# Patient Record
Sex: Female | Born: 1990 | Hispanic: Yes | Marital: Single | Smoking: Former smoker
Health system: Southern US, Community
[De-identification: ages and names within clinical notes are randomized; demographics above are authoritative.]

## PROBLEM LIST (undated history)

## (undated) DIAGNOSIS — E039 Hypothyroidism, unspecified: Secondary | ICD-10-CM

## (undated) DIAGNOSIS — G4730 Sleep apnea, unspecified: Secondary | ICD-10-CM

## (undated) DIAGNOSIS — G43909 Migraine, unspecified, not intractable, without status migrainosus: Secondary | ICD-10-CM

## (undated) DIAGNOSIS — F419 Anxiety disorder, unspecified: Secondary | ICD-10-CM

## (undated) DIAGNOSIS — E119 Type 2 diabetes mellitus without complications: Secondary | ICD-10-CM

## (undated) DIAGNOSIS — R7303 Prediabetes: Secondary | ICD-10-CM

## (undated) DIAGNOSIS — F32A Depression, unspecified: Secondary | ICD-10-CM

## (undated) DIAGNOSIS — E0500 Thyrotoxicosis with diffuse goiter without thyrotoxic crisis or storm: Secondary | ICD-10-CM

## (undated) DIAGNOSIS — M12571 Traumatic arthropathy, right ankle and foot: Secondary | ICD-10-CM

## (undated) DIAGNOSIS — Z8619 Personal history of other infectious and parasitic diseases: Secondary | ICD-10-CM

## (undated) DIAGNOSIS — M1990 Unspecified osteoarthritis, unspecified site: Secondary | ICD-10-CM

## (undated) HISTORY — DX: Traumatic arthropathy, right ankle and foot: M12.571

## (undated) HISTORY — PX: FRACTURE SURGERY: SHX138

## (undated) HISTORY — DX: Migraine, unspecified, not intractable, without status migrainosus: G43.909

## (undated) HISTORY — DX: Thyrotoxicosis with diffuse goiter without thyrotoxic crisis or storm: E05.00

## (undated) HISTORY — DX: Personal history of other infectious and parasitic diseases: Z86.19

## (undated) HISTORY — DX: Anxiety disorder, unspecified: F41.9

## (undated) HISTORY — DX: Unspecified osteoarthritis, unspecified site: M19.90

## (undated) HISTORY — DX: Type 2 diabetes mellitus without complications: E11.9

---

## 2008-07-01 HISTORY — PX: ANKLE SURGERY: SHX546

## 2015-09-14 ENCOUNTER — Emergency Department (HOSPITAL_COMMUNITY)
Admission: EM | Admit: 2015-09-14 | Discharge: 2015-09-15 | Disposition: A | Discharge status: Home / Self Care | Payer: Self-pay | Attending: Emergency Medicine | Admitting: Emergency Medicine

## 2015-09-14 ENCOUNTER — Encounter (HOSPITAL_COMMUNITY): Payer: Self-pay | Admitting: Emergency Medicine

## 2015-09-14 ENCOUNTER — Emergency Department (HOSPITAL_COMMUNITY): Payer: Self-pay

## 2015-09-14 DIAGNOSIS — J069 Acute upper respiratory infection, unspecified: Secondary | ICD-10-CM | POA: Insufficient documentation

## 2015-09-14 DIAGNOSIS — F17200 Nicotine dependence, unspecified, uncomplicated: Secondary | ICD-10-CM | POA: Insufficient documentation

## 2015-09-14 LAB — RAPID STREP SCREEN (MED CTR MEBANE ONLY): STREPTOCOCCUS, GROUP A SCREEN (DIRECT): NEGATIVE

## 2015-09-14 MED ORDER — GUAIFENESIN-CODEINE 100-10 MG/5ML PO SOLN: 5.0000 mL | Freq: Every evening | ORAL | Status: DC | PRN

## 2015-09-14 MED ORDER — BENZONATATE 100 MG PO CAPS: 100.0000 mg | ORAL_CAPSULE | Freq: Three times a day (TID) | ORAL | Status: DC

## 2015-09-14 MED ORDER — BENZONATATE 100 MG PO CAPS
100.0000 mg | ORAL_CAPSULE | Freq: Once | ORAL | Status: AC
  Administered 2015-09-15: 100 mg via ORAL
  Filled 2015-09-14: qty 1

## 2015-09-14 MED ORDER — ALBUTEROL SULFATE HFA 108 (90 BASE) MCG/ACT IN AERS
2.0000 | INHALATION_SPRAY | RESPIRATORY_TRACT | Status: DC | PRN
  Filled 2015-09-14: qty 6.7

## 2015-09-14 NOTE — Discharge Instructions (Signed)
Upper Respiratory Infection, Adult Most upper respiratory infections (URIs) are a viral infection of the air passages leading to the lungs. A URI affects the nose, throat, and upper air passages. The most common type of URI is nasopharyngitis and is typically referred to as "the common cold." URIs run their course and usually go away on their own. Most of the time, a URI does not require medical attention, but sometimes a bacterial infection in the upper airways can follow a viral infection. This is called a secondary infection. Sinus and middle ear infections are common types of secondary upper respiratory infections. Bacterial pneumonia can also complicate a URI. A URI can worsen asthma and chronic obstructive pulmonary disease (COPD). Sometimes, these complications can require emergency medical care and may be life threatening.  CAUSES Almost all URIs are caused by viruses. A virus is a type of germ and can spread from one person to another.  RISKS FACTORS You may be at risk for a URI if:   You smoke.   You have chronic heart or lung disease.  You have a weakened defense (immune) system.   You are very young or very old.   You have nasal allergies or asthma.  You work in crowded or poorly ventilated areas.  You work in health care facilities or schools. SIGNS AND SYMPTOMS  Symptoms typically develop 2-3 days after you come in contact with a cold virus. Most viral URIs last 7-10 days. However, viral URIs from the influenza virus (flu virus) can last 14-18 days and are typically more severe. Symptoms may include:   Runny or stuffy (congested) nose.   Sneezing.   Cough.   Sore throat.   Headache.   Fatigue.   Fever.   Loss of appetite.   Pain in your forehead, behind your eyes, and over your cheekbones (sinus pain).  Muscle aches.  DIAGNOSIS  Your health care provider may diagnose a URI by:  Physical exam.  Tests to check that your symptoms are not due to  another condition such as:  Strep throat.  Sinusitis.  Pneumonia.  Asthma. TREATMENT  A URI goes away on its own with time. It cannot be cured with medicines, but medicines may be prescribed or recommended to relieve symptoms. Medicines may help:  Reduce your fever.  Reduce your cough.  Relieve nasal congestion. HOME CARE INSTRUCTIONS   Take medicines only as directed by your health care provider.   Gargle warm saltwater or take cough drops to comfort your throat as directed by your health care provider.  Use a warm mist humidifier or inhale steam from a shower to increase air moisture. This may make it easier to breathe.  Drink enough fluid to keep your urine clear or pale yellow.   Eat soups and other clear broths and maintain good nutrition.   Rest as needed.   Return to work when your temperature has returned to normal or as your health care provider advises. You may need to stay home longer to avoid infecting others. You can also use a face mask and careful hand washing to prevent spread of the virus.  Increase the usage of your inhaler if you have asthma.   Do not use any tobacco products, including cigarettes, chewing tobacco, or electronic cigarettes. If you need help quitting, ask your health care provider. PREVENTION  The best way to protect yourself from getting a cold is to practice good hygiene.   Avoid oral or hand contact with people with cold   symptoms.   Wash your hands often if contact occurs.  There is no clear evidence that vitamin C, vitamin E, echinacea, or exercise reduces the chance of developing a cold. However, it is always recommended to get plenty of rest, exercise, and practice good nutrition.  SEEK MEDICAL CARE IF:   You are getting worse rather than better.   Your symptoms are not controlled by medicine.   You have chills.  You have worsening shortness of breath.  You have brown or red mucus.  You have yellow or brown nasal  discharge.  You have pain in your face, especially when you bend forward.  You have a fever.  You have swollen neck glands.  You have pain while swallowing.  You have white areas in the back of your throat. SEEK IMMEDIATE MEDICAL CARE IF:   You have severe or persistent:  Headache.  Ear pain.  Sinus pain.  Chest pain.  You have chronic lung disease and any of the following:  Wheezing.  Prolonged cough.  Coughing up blood.  A change in your usual mucus.  You have a stiff neck.  You have changes in your:  Vision.  Hearing.  Thinking.  Mood. MAKE SURE YOU:   Understand these instructions.  Will watch your condition.  Will get help right away if you are not doing well or get worse.   This information is not intended to replace advice given to you by your health care provider. Make sure you discuss any questions you have with your health care provider.   Document Released: 12/11/2000 Document Revised: 11/01/2014 Document Reviewed: 09/22/2013 Elsevier Interactive Patient Education 2016 [employer]  

## 2015-09-14 NOTE — ED Provider Notes (Signed)
CSN: [id number]     Arrival date & time 09/14/15  2018 History   First MD Initiated Contact with Patient 09/14/15 2236     Chief Complaint  Patient presents with  . Cough  . Sore Throat     (Consider location/radiation/quality/duration/timing/severity/associated sxs/prior Treatment) HPI  Heidi Ellison is a 25 y.o. female  PCP: No PCP Per Patient  Blood pressure 118/61, pulse 90, temperature 98.8 F (37.1 C), temperature source Oral, resp. rate 14, height 5' (1.524 m), weight 104.781 kg, last menstrual period 09/02/2015, SpO2 96 %.  SIGNIFICANT PMH: none CHIEF COMPLAINT: Sore throat, ear pain, nasal congestion, cough  When: One week ago PCP: No PCP Per Patient         Progression:  Worsening Chronicity: Acute Risk factors: Smoker Treatments tried:  None Relieved by: Nothing Worsened by: Coughing Associated Symptoms:  Ear pain, nasal congestion, cough should one episode of epistaxis. Reports subjective low-grade fever.  ROS: The patient denies diaphoresis, fever, headache, weakness (general or focal), confusion, change of vision,  dysphagia, aphagia, shortness of breath,  abdominal pains, nausea, vomiting, diarrhea, lower extremity swelling, rash, neck pain, chest pain   Negative ROS: Confusion, diaphoresis, , headache, weakness (general or focal), change of vision,  neck pain, dysphagia, aphagia, chest pain, shortness of breath,  back pain, abdominal pains, nausea, vomiting, diarrhea, lower extremity swelling, rash.   History reviewed. No pertinent past medical history. History reviewed. No pertinent past surgical history. No family history on file. Social History  Substance Use Topics  . Smoking status: Current Some Day Smoker  . Smokeless tobacco: None  . Alcohol Use: Yes   OB History    No data available     Review of Systems  Review of Systems All other systems negative except as documented in the HPI. All pertinent positives and negatives as reviewed in  the HPI.   Allergies  Review of patient's allergies indicates no known allergies.  Home Medications   Prior to Admission medications   Medication Sig Start Date End Date Taking? Authorizing Provider  aspirin-acetaminophen-caffeine (EXCEDRIN MIGRAINE) [phone] MG tablet Take 2 tablets by mouth every 6 (six) hours as needed for headache.   Yes Historical Provider, MD  ibuprofen (ADVIL,MOTRIN) 200 MG tablet Take 400 mg by mouth every 6 (six) hours as needed for moderate pain.   Yes Historical Provider, MD  benzonatate (TESSALON) 100 MG capsule Take 1 capsule (100 mg total) by mouth every 8 (eight) hours. 09/14/15   Evian Derringer Neva Seat, PA-C  guaiFENesin-codeine 100-10 MG/5ML syrup Take 5 mLs by mouth at bedtime as needed for cough. 09/14/15   Nollie Terlizzi Neva Seat, PA-C   BP 118/61 mmHg  Pulse 90  Temp(Src) 98.8 F (37.1 C) (Oral)  Resp 14  Ht 5' (1.524 m)  Wt 104.781 kg  BMI 45.11 kg/m2  SpO2 96%  LMP 09/02/2015 Physical Exam  Constitutional: She is oriented to person, place, and time. She appears well-developed and well-nourished. No distress.  HENT:  Head: Normocephalic and atraumatic. Head is without raccoon's eyes, without Battle's sign and without contusion.  Right Ear: Tympanic membrane, external ear and ear canal normal.  Left Ear: Tympanic membrane, external ear and ear canal normal.  Nose: Rhinorrhea present. Right sinus exhibits no maxillary sinus tenderness and no frontal sinus tenderness. Left sinus exhibits no maxillary sinus tenderness and no frontal sinus tenderness.  Mouth/Throat: Uvula is midline and mucous membranes are normal. No trismus in the jaw. Normal dentition. No dental abscesses or uvula swelling. No  oropharyngeal exudate, posterior oropharyngeal edema, posterior oropharyngeal erythema or tonsillar abscesses.  No submental edema, tongue not elevated, no trismus. No impending airway obstruction; Pt able to speak full sentences, swallow intact, no drooling, stridor, or  tonsillar/uvula displacement. No palatal petechia  Eyes: Conjunctivae are normal. Pupils are equal, round, and reactive to light.  Neck: Trachea normal, normal range of motion and full passive range of motion without pain. Neck supple. No rigidity. Normal range of motion present. No Brudzinski's sign noted.  Flexion and extension of neck without pain or difficulty. Able to breath without difficulty in extension.  Cardiovascular: Normal rate and regular rhythm.   Pulmonary/Chest: Effort normal. No stridor. No respiratory distress. She has no decreased breath sounds. She has wheezes (mild). She has no rhonchi. She has no rales.  Abdominal: Soft. There is no tenderness.  No obvious evidence of splenomegaly. Non ttp.   Musculoskeletal: Normal range of motion.  Lymphadenopathy:       Head (right side): No preauricular and no posterior auricular adenopathy present.       Head (left side): No preauricular and no posterior auricular adenopathy present.    She has cervical adenopathy.  Neurological: She is alert and oriented to person, place, and time.  Skin: Skin is warm and dry. No rash noted. She is not diaphoretic.  Psychiatric: She has a normal mood and affect.  Nursing note and vitals reviewed.   ED Course  Procedures (including critical care time) Labs Review Labs Reviewed  RAPID STREP SCREEN (NOT AT [facility])  CULTURE, GROUP A STREP [facility])    Imaging Review Dg Chest 2 View  09/14/2015  CLINICAL DATA:  Persistent productive cough for 2 weeks. EXAM: CHEST  2 VIEW COMPARISON:  None. FINDINGS: The heart size and mediastinal contours are within normal limits. There is no focal infiltrate, pulmonary edema, or pleural effusion. The visualized skeletal structures are unremarkable. IMPRESSION: No active cardiopulmonary disease. Electronically Signed   By: Sherian ReinWei-Chen  Lin M.D.   On: 09/14/2015 21:02   I have personally reviewed and evaluated these images and lab results as part of my medical  decision-making.   EKG Interpretation None      MDM   Final diagnoses:  URI (upper respiratory infection)    Pt symptoms consistent with URI. CXR negative for acute infiltrate. Pt will be discharged with symptomatic treatment.  Discussed return precautions.  Pt is hemodynamically stable & in NAD prior to discharge.  benzonatate (TESSALON) 100 MG capsule Take 1 capsule (100 mg total) by mouth every 8 (eight) hours. 21 capsule Marlon Peliffany Rockelle Heuerman, PA-C    guaiFENesin-codeine 100-10 MG/5ML syrup Take 5 mLs by mouth at bedtime as needed for cough. 60 mL Marlon Peliffany Shellyann Wandrey, PA-C      Marlon Peliffany Keisuke Hollabaugh, PA-C 09/14/15 [id number]  Geoffery Lyonsouglas Delo, MD 09/15/15 [phone]

## 2015-09-14 NOTE — ED Notes (Signed)
Pt. reports productive cough with sore throat for 2 weeks , brief epistaxis yesterday and low grade fever .

## 2015-09-15 NOTE — ED Notes (Signed)
Pt given inhaler at discharge; verbalized and demonstrated appropriate use.

## 2015-09-17 LAB — CULTURE, GROUP A STREP (THRC)

## 2015-12-20 ENCOUNTER — Emergency Department (HOSPITAL_COMMUNITY)
Admission: EM | Admit: 2015-12-20 | Discharge: 2015-12-20 | Disposition: A | Discharge status: Home / Self Care | Payer: Self-pay | Attending: Emergency Medicine | Admitting: Emergency Medicine

## 2015-12-20 ENCOUNTER — Encounter (HOSPITAL_COMMUNITY): Payer: Self-pay | Admitting: Nurse Practitioner

## 2015-12-20 DIAGNOSIS — R51 Headache: Secondary | ICD-10-CM

## 2015-12-20 DIAGNOSIS — Z7982 Long term (current) use of aspirin: Secondary | ICD-10-CM | POA: Insufficient documentation

## 2015-12-20 DIAGNOSIS — F17200 Nicotine dependence, unspecified, uncomplicated: Secondary | ICD-10-CM | POA: Insufficient documentation

## 2015-12-20 DIAGNOSIS — Z79899 Other long term (current) drug therapy: Secondary | ICD-10-CM | POA: Insufficient documentation

## 2015-12-20 DIAGNOSIS — R519 Headache, unspecified: Secondary | ICD-10-CM

## 2015-12-20 DIAGNOSIS — G43909 Migraine, unspecified, not intractable, without status migrainosus: Secondary | ICD-10-CM | POA: Insufficient documentation

## 2015-12-20 MED ORDER — METOCLOPRAMIDE HCL 5 MG/ML IJ SOLN
10.0000 mg | Freq: Once | INTRAMUSCULAR | Status: AC
  Administered 2015-12-20: 10 mg via INTRAVENOUS
  Filled 2015-12-20: qty 2

## 2015-12-20 MED ORDER — SODIUM CHLORIDE 0.9 % IV BOLUS (SEPSIS)
1000.0000 mL | Freq: Once | INTRAVENOUS | Status: AC
  Administered 2015-12-20: 1000 mL via INTRAVENOUS

## 2015-12-20 MED ORDER — DIPHENHYDRAMINE HCL 50 MG/ML IJ SOLN
25.0000 mg | Freq: Once | INTRAMUSCULAR | Status: AC
  Administered 2015-12-20: 25 mg via INTRAVENOUS
  Filled 2015-12-20: qty 1

## 2015-12-20 NOTE — Discharge Instructions (Signed)

## 2015-12-20 NOTE — ED Notes (Signed)
Pt c/o 2 day history migraine. She has taken excedrin, motrin with no relief. She reports history of migraines and this feels like her typical migraine. She is alert and breathing easily

## 2015-12-20 NOTE — ED Notes (Signed)
Provider at bedside

## 2015-12-20 NOTE — ED Provider Notes (Signed)
CSN: [id number]     Arrival date & time 12/20/15  1557 History  By signing my name below, I, Tanda RockersMargaux Venter, attest that this documentation has been prepared under the direction and in the presence of Everlene FarrierWilliam Braylea Brancato, PA-C. Electronically Signed: Tanda RockersMargaux Venter, ED Scribe. 12/20/2015. 8:42 PM.   Chief Complaint  Patient presents with  . Migraine   The history is provided by the patient. No language interpreter was used.    HPI Comments: Heidi Ellison is a 25 y.o. female who presents to the Emergency Department complaining of gradual onset, constant, 10/10, frontal migraine headache for the past 2 days. Pt has hx of migraines and states this feels like her typical migraine headache but the pain is worse than normal and has lasted longer than normal. Pt has been taking Excedin, Advil, and Ibuprofen without relief. She took Excedrin today without relief. Pt reports that Excedrin typically gives her relief. Pt also complains of blurry vision, photophobia, neck pain, nausea, and tingling in all 10 fingers. Pt does not see a neurologist for her migraines. LNMP: 12/13/2015. Denies double vision, neck stiffness, chest pain, abdominal pain, vomiting, diarrhea, weakness, numbness, phonophobia, or any other associated symptoms.    Past Medical History  Diagnosis Date  . Migraine    History reviewed. No pertinent past surgical history. History reviewed. No pertinent family history. Social History  Substance Use Topics  . Smoking status: Current Some Day Smoker  . Smokeless tobacco: None  . Alcohol Use: Yes   OB History    No data available     Review of Systems  Constitutional: Negative for fever and chills.  HENT: Negative for congestion, ear pain and sore throat.   Eyes: Positive for photophobia and visual disturbance. Negative for pain, discharge, redness and itching.  Respiratory: Negative for cough, shortness of breath and wheezing.   Cardiovascular: Negative for chest pain and  palpitations.  Gastrointestinal: Positive for nausea. Negative for vomiting, abdominal pain and diarrhea.  Genitourinary: Negative for dysuria.  Musculoskeletal: Positive for neck pain. Negative for back pain and neck stiffness.  Skin: Negative for rash.  Neurological: Positive for headaches. Negative for dizziness, seizures, syncope, speech difficulty, weakness, light-headedness and numbness.   Allergies  Review of patient's allergies indicates no known allergies.  Home Medications   Prior to Admission medications   Medication Sig Start Date End Date Taking? Authorizing Provider  aspirin-acetaminophen-caffeine (EXCEDRIN MIGRAINE) [phone] MG tablet Take 2 tablets by mouth every 6 (six) hours as needed for headache.   Yes Historical Provider, MD  ibuprofen (ADVIL,MOTRIN) 200 MG tablet Take 400 mg by mouth every 6 (six) hours as needed for moderate pain.   Yes Historical Provider, MD  benzonatate (TESSALON) 100 MG capsule Take 1 capsule (100 mg total) by mouth every 8 (eight) hours. 09/14/15   Tiffany Neva SeatGreene, PA-C  guaiFENesin-codeine 100-10 MG/5ML syrup Take 5 mLs by mouth at bedtime as needed for cough. 09/14/15   Tiffany Neva SeatGreene, PA-C   BP 100/34 mmHg  Pulse 70  Temp(Src) 98 F (36.7 C) (Oral)  Resp 16  SpO2 96%   Physical Exam  Constitutional: She is oriented to person, place, and time. She appears well-developed and well-nourished. No distress.  Nontoxic appearing.  HENT:  Head: Normocephalic and atraumatic.  Right Ear: External ear normal.  Left Ear: External ear normal.  Mouth/Throat: Oropharynx is clear and moist. No oropharyngeal exudate.  Bilateral tympanic membranes are pearly-gray without erythema or loss of landmarks.  No temporal edema or tenderness.  Eyes: Conjunctivae and EOM are normal. Pupils are equal, round, and reactive to light. Right eye exhibits no discharge. Left eye exhibits no discharge.  Neck: Normal range of motion. Neck supple. No JVD present. No  tracheal deviation present.  Able to place chin on chest, look up to ceiling, and rotate greater than 45 degrees in both directions.  No meningeal signs.  No midline neck tenderness.   Cardiovascular: Normal rate, regular rhythm, normal heart sounds and intact distal pulses.  Exam reveals no gallop and no friction rub.   No murmur heard. Pulmonary/Chest: Effort normal and breath sounds normal. No stridor. No respiratory distress. She has no wheezes. She has no rales.  Lungs are clear to auscultation bilaterally.  Abdominal: Soft. There is no tenderness. There is no guarding.  Musculoskeletal: She exhibits no edema.  Lymphadenopathy:    She has no cervical adenopathy.  Neurological: She is alert and oriented to person, place, and time. No cranial nerve deficit. Coordination normal.  She is alert and oriented 3. CN nerves are intact.  Speech is clear and coherent.  Finger to nose intact.  No arm drift.  Normal gait.  Sensation intact to bilateral upper and lower extremities.  Skin: Skin is warm and dry. No rash noted. She is not diaphoretic. No erythema. No pallor.  Psychiatric: She has a normal mood and affect. Her behavior is normal.  Nursing note and vitals reviewed.   ED Course  Procedures (including critical care time)  DIAGNOSTIC STUDIES: Oxygen Saturation is 99% on RA, normal by my interpretation.    COORDINATION OF CARE: 8:42 PM-Discussed treatment plan which includes headache cocktail with pt at bedside and pt agreed to plan.   Labs Review Labs Reviewed - No data to display  Imaging Review No results found.    EKG Interpretation None      Filed Vitals:   12/20/15 2015 12/20/15 2130 12/20/15 2200 12/20/15 2230  BP: 105/71 102/58 106/53 100/34  Pulse: 66 72 63 70  Temp:      TempSrc:      Resp:  16    SpO2: 98% 98% 97% 96%     MDM   Meds given in ED:  Medications  sodium chloride 0.9 % bolus 1,000 mL (0 mLs Intravenous Stopped 12/20/15 2215)   metoCLOPramide (REGLAN) injection 10 mg (10 mg Intravenous Given 12/20/15 2049)  diphenhydrAMINE (BENADRYL) injection 25 mg (25 mg Intravenous Given 12/20/15 2048)    New Prescriptions   No medications on file    Final diagnoses:  Bad headache   This is a 25 y.o. female who presents to the Emergency Department complaining of gradual onset, constant, 10/10, frontal migraine headache for the past 2 days. Pt has hx of migraines and states this feels like her typical migraine headache but the pain is worse than normal and has lasted longer than normal. Pt has been taking Excedin, Advil, and Ibuprofen without relief. She took Excedrin today without relief. Pt reports that Excedrin typically gives her relief. On exam the patient is afebrile nontoxic appearing. Pt HA treated and improved while in ED.  Presentation is like pts typical HA and non concerning for [facility], ICH, Meningitis, or temporal arteritis. Pt is afebrile with no focal neuro deficits, nuchal rigidity, or change in vision. Pt is to follow up with PCP to discuss prophylactic medication. Pt verbalizes understanding and is agreeable with plan to dc. Prior to discharge she reports her pain is down to 2 out of 10 and she  feels ready for discharge. I advised the patient to follow-up with their primary care provider this week. I advised the patient to return to the emergency department with new or worsening symptoms or new concerns. The patient verbalized understanding and agreement with plan.    I personally performed the services described in this documentation, which was scribed in my presence. The recorded information has been reviewed and is accurate.          Everlene Farrier, PA-C 12/20/15 2244  Arby Barrette, MD 12/20/15 [phone]

## 2016-01-11 DIAGNOSIS — G43909 Migraine, unspecified, not intractable, without status migrainosus: Secondary | ICD-10-CM

## 2016-01-11 HISTORY — DX: Migraine, unspecified, not intractable, without status migrainosus: G43.909

## 2016-07-31 ENCOUNTER — Encounter: Payer: Self-pay | Admitting: Obstetrics and Gynecology

## 2016-09-13 LAB — HM HIV SCREENING LAB: HM HIV SCREENING: NEGATIVE

## 2016-09-13 LAB — HM PAP SMEAR

## 2016-09-13 LAB — HM HEPATITIS C SCREENING LAB: HM HEPATITIS C SCREENING: NEGATIVE

## 2016-09-13 LAB — HSV 1 ANTIBODY, IGG: HSV 1 IgG, Type Spec: >58

## 2016-09-17 ENCOUNTER — Other Ambulatory Visit: Payer: Self-pay | Admitting: Obstetrics & Gynecology

## 2016-09-17 DIAGNOSIS — N644 Mastodynia: Secondary | ICD-10-CM

## 2017-01-29 HISTORY — PX: WISDOM TOOTH EXTRACTION: SHX21

## 2017-04-17 ENCOUNTER — Ambulatory Visit (HOSPITAL_COMMUNITY)
Admission: EM | Admit: 2017-04-17 | Discharge: 2017-04-17 | Disposition: A | Discharge status: Home / Self Care | Payer: BLUE CROSS/BLUE SHIELD | Attending: Emergency Medicine | Admitting: Emergency Medicine

## 2017-04-17 ENCOUNTER — Encounter (HOSPITAL_COMMUNITY): Payer: Self-pay | Admitting: Emergency Medicine

## 2017-04-17 DIAGNOSIS — G43909 Migraine, unspecified, not intractable, without status migrainosus: Secondary | ICD-10-CM | POA: Diagnosis not present

## 2017-04-17 DIAGNOSIS — R110 Nausea: Secondary | ICD-10-CM

## 2017-04-17 MED ORDER — ONDANSETRON 4 MG PO TBDP
ORAL_TABLET | ORAL | Status: AC
  Filled 2017-04-17: qty 1

## 2017-04-17 MED ORDER — ONDANSETRON 4 MG PO TBDP
4.0000 mg | ORAL_TABLET | Freq: Once | ORAL | Status: AC
  Administered 2017-04-17: 4 mg via ORAL

## 2017-04-17 MED ORDER — KETOROLAC TROMETHAMINE 60 MG/2ML IM SOLN
60.0000 mg | Freq: Once | INTRAMUSCULAR | Status: AC
  Administered 2017-04-17: 60 mg via INTRAMUSCULAR

## 2017-04-17 MED ORDER — KETOROLAC TROMETHAMINE 60 MG/2ML IM SOLN
INTRAMUSCULAR | Status: AC
  Filled 2017-04-17: qty 2

## 2017-04-17 NOTE — ED Provider Notes (Signed)
[facility]    CSN: [id number] Arrival date & time: 04/17/17  1534     History   Chief Complaint Chief Complaint  Patient presents with  . Headache    HPI Heidi Ellison is a 26 y.o. female.   Heidi Ellison presents with complaints of headache which started last night and persisted today. It is more severe than normal headaches for her. Feels similar to previous migraines. She took ibuprofen last at 0900 which did not seem to help, excedrine last night. Exacerbated by movement, light and sound. Pain is pounding and to posterior and frontal head as well as eyes. Nausea without vomiting. Has seen neurology in the past for her headaches but has not returned due to recent move to the area. No head injury. No history of elevated blood pressure but states last week a friend of hers took it and it was similar as today.       Past Medical History:  Diagnosis Date  . Migraine     There are no active problems to display for this patient.   Past Surgical History:  Procedure Laterality Date  . ANKLE SURGERY Bilateral     OB History    No data available       Home Medications    Prior to Admission medications   Medication Sig Start Date End Date Taking? Authorizing Provider  aspirin-acetaminophen-caffeine (EXCEDRIN MIGRAINE) 7635561056 MG tablet Take 2 tablets by mouth every 6 (six) hours as needed for headache.   Yes [provider]  benzonatate (TESSALON) 100 MG capsule Take 1 capsule (100 mg total) by mouth every 8 (eight) hours. 09/14/15   Marlon Pel, PA-C  guaiFENesin-codeine 100-10 MG/5ML syrup Take 5 mLs by mouth at bedtime as needed for cough. 09/14/15   Marlon Pel, PA-C  ibuprofen (ADVIL,MOTRIN) 200 MG tablet Take 400 mg by mouth every 6 (six) hours as needed for moderate pain.    [provider]    Family History No family history on file.  Social History Social History  Substance Use Topics  . Smoking status: Current Some  Day Smoker  . Smokeless tobacco: Not on file  . Alcohol use Yes     Allergies   Patient has no known allergies.   Review of Systems Review of Systems  Constitutional: Negative.   HENT: Negative.   Eyes: Negative.   Respiratory: Negative.   Gastrointestinal: Positive for nausea.  Neurological: Positive for headaches.     Physical Exam Triage Vital Signs ED Triage Vitals  Enc Vitals Group     BP 04/17/17 1646 (!) 159/88     Pulse Rate 04/17/17 1646 74     Resp 04/17/17 1646 18     Temp 04/17/17 1646 98.4 F (36.9 C)     Temp Source 04/17/17 1646 Oral     SpO2 04/17/17 1646 98 %     Weight --      Height --      Head Circumference --      Peak Flow --      Pain Score 04/17/17 1658 10     Pain Loc --      Pain Edu? --      Excl. in GC? --    No data found.   Updated Vital Signs BP 110/81 (BP Location: Left Arm) Comment (BP Location): large  Pulse 74   Temp 98.4 F (36.9 C) (Oral)   Resp 18   LMP 04/17/2017   SpO2 98%  Visual Acuity Right Eye Distance:   Left Eye Distance:   Bilateral Distance:    Right Eye Near:   Left Eye Near:    Bilateral Near:     Physical Exam  Constitutional: She is oriented to person, place, and time. She appears well-developed and well-nourished.  HENT:  Head: Normocephalic and atraumatic.  Eyes: Pupils are equal, round, and reactive to light. Conjunctivae and EOM are normal.  Neck: Normal range of motion. Neck supple.  Cardiovascular: Normal rate, regular rhythm and normal heart sounds.   Pulmonary/Chest: Effort normal and breath sounds normal.  Lymphadenopathy:    She has no cervical adenopathy.  Neurological: She is alert and oriented to person, place, and time. She displays normal reflexes. No cranial nerve deficit or sensory deficit. She exhibits normal muscle tone. Coordination normal.  Skin: Skin is warm and dry.     UC Treatments / Results  Labs (all labs ordered are listed, but only abnormal results are  displayed) Labs Reviewed - No data to display  EKG  EKG Interpretation None       Radiology No results found.  Procedures Procedures (including critical care time)  Medications Ordered in UC Medications  ketorolac (TORADOL) injection 60 mg (60 mg Intramuscular Given 04/17/17 1729)  ondansetron (ZOFRAN-ODT) disintegrating tablet 4 mg (4 mg Oral Given 04/17/17 1728)     Initial Impression / Assessment and Plan / UC Course  I have reviewed the triage vital signs and the nursing notes.  Pertinent labs & imaging results that were available during my care of the patient were reviewed by me and considered in my medical decision making (see chart for details).     Without neurological findings on exam. Findings consistent with migraine headache. toradol and zofran provided in clinic today, recommended rest and fluids to promote abortion of headache. Establish with primary care provider for BP recheck and may need to return to neurology. Patient agreeable to plan and verbalized understanding of instructions.   Final Clinical Impressions(s) / UC Diagnoses   Final diagnoses:  Migraine without status migrainosus, not intractable, unspecified migraine type    New Prescriptions Discharge Medication List as of 04/17/2017  5:22 PM       Controlled Substance Prescriptions [place] Controlled Substance Registry consulted? Not Applicable   Georgetta HaberBurky, Kiriana Worthington B, NP 04/17/17 [phone]

## 2017-04-17 NOTE — ED Triage Notes (Signed)
Headache started last night.  Patient reports nausea, no vomiting.  Pain in the back of head and sides of head.  excedrin usually helps, but not this time

## 2017-04-22 ENCOUNTER — Encounter: Payer: Self-pay | Admitting: Neurology

## 2017-04-22 ENCOUNTER — Ambulatory Visit (INDEPENDENT_AMBULATORY_CARE_PROVIDER_SITE_OTHER): Payer: BLUE CROSS/BLUE SHIELD | Admitting: Neurology

## 2017-04-22 DIAGNOSIS — IMO0002 Reserved for concepts with insufficient information to code with codable children: Secondary | ICD-10-CM

## 2017-04-22 DIAGNOSIS — G43709 Chronic migraine without aura, not intractable, without status migrainosus: Secondary | ICD-10-CM | POA: Diagnosis not present

## 2017-04-22 HISTORY — DX: Reserved for concepts with insufficient information to code with codable children: IMO0002

## 2017-04-22 MED ORDER — SUMATRIPTAN SUCCINATE 50 MG PO TABS: 50.0000 mg | ORAL_TABLET | ORAL | 11 refills | Status: DC | PRN

## 2017-04-22 MED ORDER — TOPIRAMATE 100 MG PO TABS: 100.0000 mg | ORAL_TABLET | Freq: Two times a day (BID) | ORAL | 11 refills | Status: DC

## 2017-04-22 MED ORDER — ONDANSETRON 4 MG PO TBDP: 4.0000 mg | ORAL_TABLET | Freq: Three times a day (TID) | ORAL | 6 refills | Status: DC | PRN

## 2017-04-22 NOTE — Patient Instructions (Signed)
Magnesium oxide 400 mg twice a day Riboflavin  100 mg twice a day 

## 2017-04-22 NOTE — Progress Notes (Signed)
PATIENT: Heidi Ellison DOB: March 06, 1991  Chief Complaint  Patient presents with  . Migraine    She is here with her cousin, Heidi Ellison.  Reports being treated in the ED on 04/17/17 for a severe migraine that failed to respond to OTC NSAIDS.  She was experiencing pain, nausea and sensitivity to light/noise.  Says will also sometimes have numbness in her fingertips during active headaches.  She has been having severe headaches since age 26.  She has never been on medications for migraines.  She ususally takes Excedrin and Ibuprofen without relief.   Marland Kitchen. PCP    No PCP - referred from ED     HISTORICAL  Heidi Ellison is a 26 year old female, seen in refer by  emergency room for evaluation of migraine headache, she is accompanied by her cousin at today's clinical visit, initial evaluation was April 22 2017.  I reviewed and summarized referring note, she has history of migraine headaches since high school, her typical migraine are bilateral frontal retro-orbital area pressure headache, sometimes pounding, with associated light sensitivity, nauseous, blurry vision, lasting 1-2 days,  Trigger for her migraines are menstruation, stress, sleep deprivation, hungry, bright light, smells,  Her migraine used to responding well to over-the-counter ibuprofen Excedrin Migraine and Tylenol treatment, but over the past couple years, a migraine gets more prolonged, more severe, less responsive to over-the-counter medications.  She presented to emergency room  on April 17 2017, she complains of headache persistent for more than 2 days, failed to improve by ibuprofen, Excedrin Migraine, she was treated with Toradol 60 mg intramuscular injection, Zofran as needed,  REVIEW OF SYSTEMS: Full 14 system review of systems performed and notable only for blurred vision, eye pain, snoring, headache, numbness, dizziness  ALLERGIES: No Known Allergies  HOME MEDICATIONS: Current Outpatient Prescriptions    Medication Sig Dispense Refill  . aspirin-acetaminophen-caffeine (EXCEDRIN MIGRAINE) 250-250-65 MG tablet Take 2 tablets by mouth every 6 (six) hours as needed for headache.    . ibuprofen (ADVIL,MOTRIN) 200 MG tablet Take 400 mg by mouth every 6 (six) hours as needed for moderate pain.     No current facility-administered medications for this visit.     PAST MEDICAL HISTORY: Past Medical History:  Diagnosis Date  . Migraine     PAST SURGICAL HISTORY: Past Surgical History:  Procedure Laterality Date  . ANKLE SURGERY Bilateral     FAMILY HISTORY: Family History  Problem Relation Age of Onset  . Migraines Mother   . Healthy Father   . Diabetes Maternal Grandfather   . Diabetes Paternal Grandfather     SOCIAL HISTORY:  Social History   Social History  . Marital status: Single    Spouse name: N/A  . Number of children: 0  . Years of education: HS   Occupational History  . Customer service representative    Social History Main Topics  . Smoking status: Current Some Day Smoker    Types: Cigarettes  . Smokeless tobacco: Never Used     Comment: 3 cigarettes per day  . Alcohol use Yes     Comment: Occasionally  . Drug use: No  . Sexual activity: Not on file   Other Topics Concern  . Not on file   Social History Narrative   Lives at home alone.   Right-handed.   1-2 cups caffeine per day.     PHYSICAL EXAM   Vitals:   04/22/17 1322  BP: 133/87  Pulse: 85  Weight: 235  lb 4 oz (106.7 kg)  Height: 5' (1.524 m)    Not recorded      Body mass index is 45.94 kg/m.  PHYSICAL EXAMNIATION:  Gen: NAD, conversant, well nourised, obese, well groomed                     Cardiovascular: Regular rate rhythm, no peripheral edema, warm, nontender. Eyes: Conjunctivae clear without exudates or hemorrhage Neck: Supple, no carotid bruits. Pulmonary: Clear to auscultation bilaterally   NEUROLOGICAL EXAM:  MENTAL STATUS: Speech:    Speech is normal; fluent  and spontaneous with normal comprehension.  Cognition:     Orientation to time, place and person     Normal recent and remote memory     Normal Attention span and concentration     Normal Language, naming, repeating,spontaneous speech     Fund of knowledge   CRANIAL NERVES: CN II: Visual fields are full to confrontation. Fundoscopic exam is normal with sharp discs and no vascular changes. Pupils are round equal and briskly reactive to light. CN III, IV, VI: extraocular movement are normal. No ptosis. CN V: Facial sensation is intact to pinprick in all 3 divisions bilaterally. Corneal responses are intact.  CN VII: Face is symmetric with normal eye closure and smile. CN VIII: Hearing is normal to rubbing fingers CN IX, X: Palate elevates symmetrically. Phonation is normal. CN XI: Head turning and shoulder shrug are intact CN XII: Tongue is midline with normal movements and no atrophy.  MOTOR: There is no pronator drift of out-stretched arms. Muscle bulk and tone are normal. Muscle strength is normal.  REFLEXES: Reflexes are 2+ and symmetric at the biceps, triceps, knees, and ankles. Plantar responses are flexor.  SENSORY: Intact to light touch, pinprick, positional sensation and vibratory sensation are intact in fingers and toes.  COORDINATION: Rapid alternating movements and fine finger movements are intact. There is no dysmetria on finger-to-nose and heel-knee-shin.    GAIT/STANCE: Posture is normal. Gait is steady with normal steps, base, arm swing, and turning. Heel and toe walking are normal. Tandem gait is normal.  Romberg is absent.   DIAGNOSTIC DATA (LABS, IMAGING, TESTING) - I reviewed patient records, labs, notes, testing and imaging myself where available.   ASSESSMENT AND PLAN  [name] is a 26 y.o. female    Chronic migraine headaches  Topamax 100 mg twice a day as preventive medications  Imitrex 50 mg as needed, Zofran, Aleve as needed  Levert Feinstein,  M.D. Ph.D.  [facility] [address] [place], [place] [address] Ph: [phone] Fax: [phone]  CC: Referring Provider

## 2017-07-03 DIAGNOSIS — G43909 Migraine, unspecified, not intractable, without status migrainosus: Secondary | ICD-10-CM | POA: Diagnosis not present

## 2017-07-03 DIAGNOSIS — R202 Paresthesia of skin: Secondary | ICD-10-CM | POA: Diagnosis not present

## 2017-07-03 DIAGNOSIS — G5603 Carpal tunnel syndrome, bilateral upper limbs: Secondary | ICD-10-CM | POA: Diagnosis not present

## 2017-07-03 DIAGNOSIS — M5412 Radiculopathy, cervical region: Secondary | ICD-10-CM | POA: Diagnosis not present

## 2017-07-03 DIAGNOSIS — R201 Hypoesthesia of skin: Secondary | ICD-10-CM | POA: Diagnosis not present

## 2017-07-09 ENCOUNTER — Other Ambulatory Visit: Payer: Self-pay | Admitting: Specialist

## 2017-07-09 DIAGNOSIS — R51 Headache: Principal | ICD-10-CM

## 2017-07-09 DIAGNOSIS — R519 Headache, unspecified: Secondary | ICD-10-CM

## 2017-07-09 DIAGNOSIS — R201 Hypoesthesia of skin: Secondary | ICD-10-CM

## 2017-07-14 ENCOUNTER — Ambulatory Visit
Admission: RE | Admit: 2017-07-14 | Discharge: 2017-07-14 | Disposition: A | Discharge status: Home / Self Care | Payer: BLUE CROSS/BLUE SHIELD | Source: Ambulatory Visit | Attending: Specialist | Admitting: Specialist

## 2017-07-14 DIAGNOSIS — R51 Headache: Principal | ICD-10-CM

## 2017-07-14 DIAGNOSIS — R201 Hypoesthesia of skin: Secondary | ICD-10-CM

## 2017-07-14 DIAGNOSIS — R519 Headache, unspecified: Secondary | ICD-10-CM

## 2017-07-28 ENCOUNTER — Ambulatory Visit: Payer: BLUE CROSS/BLUE SHIELD | Admitting: Neurology

## 2017-07-28 ENCOUNTER — Telehealth: Payer: Self-pay | Admitting: *Deleted

## 2017-07-28 NOTE — Telephone Encounter (Signed)
No showed follow up appointment. 

## 2017-07-29 ENCOUNTER — Encounter: Payer: Self-pay | Admitting: Neurology

## 2017-08-01 DIAGNOSIS — M1990 Unspecified osteoarthritis, unspecified site: Secondary | ICD-10-CM | POA: Insufficient documentation

## 2017-10-21 ENCOUNTER — Other Ambulatory Visit: Payer: Self-pay | Admitting: *Deleted

## 2017-10-21 ENCOUNTER — Encounter: Payer: Self-pay | Admitting: *Deleted
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
## 2017-10-22 ENCOUNTER — Ambulatory Visit (INDEPENDENT_AMBULATORY_CARE_PROVIDER_SITE_OTHER): Payer: BLUE CROSS/BLUE SHIELD | Admitting: Physician Assistant

## 2017-10-22 ENCOUNTER — Encounter: Payer: Self-pay | Admitting: Physician Assistant

## 2017-10-22 VITALS — BP 120/86 | HR 76 | Temp 98.3°F | Ht 60.0 in | Wt 242.4 lb

## 2017-10-22 DIAGNOSIS — Z720 Tobacco use: Secondary | ICD-10-CM | POA: Insufficient documentation

## 2017-10-22 DIAGNOSIS — R1905 Periumbilic swelling, mass or lump: Secondary | ICD-10-CM

## 2017-10-22 DIAGNOSIS — R0683 Snoring: Secondary | ICD-10-CM | POA: Diagnosis not present

## 2017-10-22 DIAGNOSIS — Z7689 Persons encountering health services in other specified circumstances: Secondary | ICD-10-CM

## 2017-10-22 DIAGNOSIS — R400 Somnolence: Secondary | ICD-10-CM

## 2017-10-22 DIAGNOSIS — E669 Obesity, unspecified: Secondary | ICD-10-CM | POA: Diagnosis not present

## 2017-10-22 DIAGNOSIS — Z23 Encounter for immunization: Secondary | ICD-10-CM

## 2017-10-22 HISTORY — DX: Tobacco use: Z72.0

## 2017-10-22 NOTE — Patient Instructions (Signed)
It was great to meet you!  Please return at your convenience for a physical.  You will be contacted about your sleep study and your appointment with surgery.  If you develop any severe abdominal pain, please seek medical attention.

## 2017-10-24 ENCOUNTER — Encounter: Payer: Self-pay | Admitting: Physician Assistant

## 2017-10-24 DIAGNOSIS — N809 Endometriosis, unspecified: Secondary | ICD-10-CM

## 2017-10-24 HISTORY — DX: Endometriosis, unspecified: N80.9

## 2017-10-24 LAB — HSV 2 ANTIBODY, IGG: HSV 2 IGG,TYPE SPECIFIC AB: <0.9

## 2017-10-27 ENCOUNTER — Telehealth: Payer: Self-pay | Admitting: Physician Assistant

## 2017-10-27 NOTE — Telephone Encounter (Signed)
Copied from CRM [id number]. Topic: Referral - Status >> Oct 27, 2017  1:49 PM Mickel Baas B, [place] wrote: Reason for CRM: Patient calling to inquire about the general surgery referral being place. Would like an updates. CB#: [phone]

## 2017-10-27 NOTE — Telephone Encounter (Signed)
Keith, please see message. 

## 2017-10-27 NOTE — Telephone Encounter (Signed)
See note

## 2017-10-30 ENCOUNTER — Telehealth: Payer: Self-pay | Admitting: Physician Assistant

## 2017-10-30 NOTE — Telephone Encounter (Signed)
Copied from CRM [id number]. Topic: Quick Communication - See Telephone Encounter >> Oct 30, 2017 12:49 PM Rudi Coco, NT wrote: CRM for notification. See Telephone encounter for: 10/30/17.   Terry calling from [facility] sleep study to let PA S. Bufford Buttner know that pts. Insurance will not cover sleep study in clinic but will cover at home sleep study. Needing to be updated in epic. Aurther Loft can be reached at [phone]

## 2017-10-30 NOTE — Telephone Encounter (Signed)
Referral was faxed to [facility] on 10/22/17. They will reach out to the patient to schedule her.

## 2017-10-31 ENCOUNTER — Other Ambulatory Visit: Payer: Self-pay | Admitting: Physician Assistant

## 2017-10-31 DIAGNOSIS — R0683 Snoring: Secondary | ICD-10-CM

## 2017-10-31 DIAGNOSIS — R400 Somnolence: Secondary | ICD-10-CM

## 2017-10-31 DIAGNOSIS — E669 Obesity, unspecified: Secondary | ICD-10-CM

## 2017-10-31 NOTE — Telephone Encounter (Signed)
Left message on voicemail to call office.  

## 2017-10-31 NOTE — Telephone Encounter (Signed)
Patient returned phone call, I advised the [facility] to let the patient know "that her insurance would prefer a home sleep test and Jarold Motto, PA-C has put in the order." Call patient back regarding test.

## 2017-10-31 NOTE — Telephone Encounter (Signed)
Heidi Ellison -- please call patient and let her know that her insurance would prefer a home sleep test, I have put this order in.  [name] -- please cancel my other referral and see new "home sleep test" referral. Please advise if any thing else needs to be done on my end.  Jarold Motto PA-C

## 2017-10-31 NOTE — Telephone Encounter (Signed)
Pt called back told her insurance will not cover sleep study in clinic prefer home sleep study. Orders have been changed and someone will be calling you to schedule. Pt verbalized understanding.

## 2017-11-10 ENCOUNTER — Emergency Department (HOSPITAL_COMMUNITY)
Admission: EM | Admit: 2017-11-10 | Discharge: 2017-11-10 | Disposition: A | Discharge status: Home / Self Care | Payer: BLUE CROSS/BLUE SHIELD | Attending: Emergency Medicine | Admitting: Emergency Medicine

## 2017-11-10 ENCOUNTER — Emergency Department (HOSPITAL_COMMUNITY): Payer: BLUE CROSS/BLUE SHIELD

## 2017-11-10 ENCOUNTER — Encounter (HOSPITAL_COMMUNITY): Payer: Self-pay

## 2017-11-10 DIAGNOSIS — Z79899 Other long term (current) drug therapy: Secondary | ICD-10-CM | POA: Insufficient documentation

## 2017-11-10 DIAGNOSIS — Z7982 Long term (current) use of aspirin: Secondary | ICD-10-CM | POA: Diagnosis not present

## 2017-11-10 DIAGNOSIS — M542 Cervicalgia: Secondary | ICD-10-CM | POA: Diagnosis not present

## 2017-11-10 DIAGNOSIS — M546 Pain in thoracic spine: Secondary | ICD-10-CM

## 2017-11-10 DIAGNOSIS — F17210 Nicotine dependence, cigarettes, uncomplicated: Secondary | ICD-10-CM | POA: Diagnosis not present

## 2017-11-10 DIAGNOSIS — S299XXA Unspecified injury of thorax, initial encounter: Secondary | ICD-10-CM | POA: Diagnosis not present

## 2017-11-10 MED ORDER — METHOCARBAMOL 500 MG PO TABS
500.0000 mg | ORAL_TABLET | Freq: Once | ORAL | Status: AC
  Administered 2017-11-10: 500 mg via ORAL
  Filled 2017-11-10: qty 1

## 2017-11-10 MED ORDER — METHOCARBAMOL 500 MG PO TABS: 500.0000 mg | ORAL_TABLET | Freq: Two times a day (BID) | ORAL | 0 refills | Status: DC
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
## 2017-11-10 NOTE — ED Triage Notes (Signed)
Pt states she had a fall in [employer] on some laundry detergent yesterday. Pt states she was sore in her neck and head today upon waking.

## 2017-11-10 NOTE — Discharge Instructions (Addendum)
Please read attached information. If you experience any new or worsening signs or symptoms please return to the emergency room for evaluation. Please follow-up with your primary care provider or specialist as discussed. Please use medication prescribed only as directed and discontinue taking if you have any concerning signs or symptoms.   °

## 2017-11-13 DIAGNOSIS — R1905 Periumbilic swelling, mass or lump: Secondary | ICD-10-CM | POA: Diagnosis not present

## 2017-11-14 ENCOUNTER — Other Ambulatory Visit: Payer: Self-pay | Admitting: General Surgery

## 2017-11-14 DIAGNOSIS — R1905 Periumbilic swelling, mass or lump: Secondary | ICD-10-CM

## 2017-11-17 ENCOUNTER — Encounter: Payer: Self-pay | Admitting: Physician Assistant

## 2017-11-17 ENCOUNTER — Ambulatory Visit: Payer: BLUE CROSS/BLUE SHIELD | Admitting: Physician Assistant

## 2017-11-17 DIAGNOSIS — Z0289 Encounter for other administrative examinations: Secondary | ICD-10-CM

## 2017-11-25 ENCOUNTER — Ambulatory Visit
Admission: RE | Admit: 2017-11-25 | Discharge: 2017-11-25 | Disposition: A | Discharge status: Home / Self Care | Payer: BLUE CROSS/BLUE SHIELD | Source: Ambulatory Visit | Attending: General Surgery | Admitting: General Surgery

## 2017-11-25 DIAGNOSIS — R1905 Periumbilic swelling, mass or lump: Secondary | ICD-10-CM

## 2017-11-25 DIAGNOSIS — K760 Fatty (change of) liver, not elsewhere classified: Secondary | ICD-10-CM | POA: Diagnosis not present

## 2017-12-10 ENCOUNTER — Ambulatory Visit (HOSPITAL_BASED_OUTPATIENT_CLINIC_OR_DEPARTMENT_OTHER): Payer: BLUE CROSS/BLUE SHIELD | Attending: Physician Assistant | Admitting: Internal Medicine

## 2017-12-10 DIAGNOSIS — R0683 Snoring: Secondary | ICD-10-CM | POA: Insufficient documentation

## 2017-12-10 DIAGNOSIS — G4733 Obstructive sleep apnea (adult) (pediatric): Secondary | ICD-10-CM | POA: Insufficient documentation

## 2017-12-10 DIAGNOSIS — R400 Somnolence: Secondary | ICD-10-CM | POA: Insufficient documentation

## 2017-12-10 DIAGNOSIS — E669 Obesity, unspecified: Secondary | ICD-10-CM | POA: Insufficient documentation

## 2017-12-18 DIAGNOSIS — R400 Somnolence: Secondary | ICD-10-CM

## 2017-12-18 DIAGNOSIS — R0683 Snoring: Secondary | ICD-10-CM | POA: Diagnosis not present

## 2017-12-18 DIAGNOSIS — E669 Obesity, unspecified: Secondary | ICD-10-CM | POA: Diagnosis not present

## 2017-12-18 NOTE — Procedures (Signed)
Patient Name: Heidi Ellison, Heidi Ellison Date: 12/10/2017 Gender: Female D.O.B: 1991/03/09 Age (years): 27 Referring Provider: Jarold Motto PA Height (inches): 60 Interpreting Physician: Jetty Duhamel MD, ABSM Weight (lbs): 237 RPSGT: [place] BMI: 46 MRN: [id number] Neck Size: 16.00  CLINICAL INFORMATION Sleep Study Type: HST Indication for sleep study: Daytime Fatigue, Snoring  Epworth Sleepiness Score: 8  SLEEP STUDY TECHNIQUE A multi-channel overnight portable sleep study was performed. The channels recorded were: nasal airflow, thoracic respiratory movement, and oxygen saturation with a pulse oximetry. Snoring was also monitored.  MEDICATIONS Patient self administered medications include: none reported.  SLEEP ARCHITECTURE Patient was studied for 488 minutes. The sleep efficiency was 99.4 % and the patient was supine for 49.2%. The arousal index was 0.0 per hour.  RESPIRATORY PARAMETERS The overall AHI was 9.1 per hour, with a central apnea index of 0.0 per hour.  The oxygen nadir was 81% during sleep.  CARDIAC DATA Mean heart rate during sleep was 66.2 bpm.  IMPRESSIONS - Mild obstructive sleep apnea occurred during this study (AHI = 9.1/h). - No significant central sleep apnea occurred during this study (CAI = 0.0/h). - Moderate oxygen desaturation was noted during this study (Min O2 = 81%). - Patient snored.  DIAGNOSIS - Obstructive Sleep Apnea (327.23 [G47.33 ICD-10])  RECOMMENDATIONS - Treatment for mild OSA is directed at symptoms. Conservative measures might include observation, weight loss, sleep position off back. - Other options, including CPAP titration study or DME autopap, or a fitted oral appliance, might be appropriate based on clinidal judgment. - Avoid alcohol, sedatives and other CNS depressants that may worsen sleep apnea and disrupt normal sleep architecture. - Sleep hygiene should be reviewed to assess factors that may improve  sleep quality. - Weight management and regular exercise should be initiated or continued.  [Electronically signed] 12/18/2017 12:30 PM  Jetty Duhamel MD, ABSM Diplomate, American Board of Sleep Medicine   NPI: [id number]                          Jetty Duhamel Diplomate, American Board of Sleep Medicine  ELECTRONICALLY SIGNED ON:  12/18/2017, 12:26 PM [facility] PH: [phone]   FX: [phone] ACCREDITED BY THE AMERICAN ACADEMY OF SLEEP MEDICINE

## 2018-01-19 ENCOUNTER — Emergency Department (HOSPITAL_COMMUNITY): Payer: BLUE CROSS/BLUE SHIELD

## 2018-01-19 ENCOUNTER — Emergency Department (HOSPITAL_COMMUNITY)
Admission: EM | Admit: 2018-01-19 | Discharge: 2018-01-19 | Disposition: A | Discharge status: Home / Self Care | Payer: BLUE CROSS/BLUE SHIELD | Attending: Emergency Medicine | Admitting: Emergency Medicine

## 2018-01-19 ENCOUNTER — Other Ambulatory Visit: Payer: Self-pay

## 2018-01-19 ENCOUNTER — Encounter (HOSPITAL_COMMUNITY): Payer: Self-pay | Admitting: *Deleted

## 2018-01-19 DIAGNOSIS — R197 Diarrhea, unspecified: Secondary | ICD-10-CM | POA: Diagnosis not present

## 2018-01-19 DIAGNOSIS — R1011 Right upper quadrant pain: Secondary | ICD-10-CM | POA: Diagnosis not present

## 2018-01-19 DIAGNOSIS — K2900 Acute gastritis without bleeding: Secondary | ICD-10-CM | POA: Diagnosis not present

## 2018-01-19 DIAGNOSIS — Z79899 Other long term (current) drug therapy: Secondary | ICD-10-CM | POA: Diagnosis not present

## 2018-01-19 DIAGNOSIS — R1013 Epigastric pain: Secondary | ICD-10-CM | POA: Diagnosis not present

## 2018-01-19 DIAGNOSIS — F17210 Nicotine dependence, cigarettes, uncomplicated: Secondary | ICD-10-CM | POA: Diagnosis not present

## 2018-01-19 LAB — COMPREHENSIVE METABOLIC PANEL
ALBUMIN: 3.9 g/dL (ref 3.5–5.0)
ALT: 78 U/L — ABNORMAL HIGH (ref 0–44)
AST: 49 U/L — AB (ref 15–41)
Alkaline Phosphatase: 49 U/L (ref 38–126)
Anion gap: 9 (ref 5–15)
BILIRUBIN TOTAL: 0.7 mg/dL (ref 0.3–1.2)
CO2: 24 mmol/L (ref 22–32)
Calcium: 9 mg/dL (ref 8.9–10.3)
Chloride: 105 mmol/L (ref 98–111)
Creatinine, Ser: 0.63 mg/dL (ref 0.44–1.00)
GFR calc Af Amer: >60 mL/min (ref >60)
GFR calc non Af Amer: >60 mL/min (ref >60)
GLUCOSE: 89 mg/dL (ref 70–99)
POTASSIUM: 4.2 mmol/L (ref 3.5–5.1)
SODIUM: 138 mmol/L (ref 135–145)
TOTAL PROTEIN: 7 g/dL (ref 6.5–8.1)

## 2018-01-19 LAB — URINALYSIS, MICROSCOPIC (REFLEX): RBC / HPF: NONE SEEN RBC/hpf (ref 0–5)

## 2018-01-19 LAB — LIPASE, BLOOD: Lipase: 37 U/L (ref 11–51)

## 2018-01-19 LAB — CBC
HEMATOCRIT: 40 % (ref 36.0–46.0)
HEMOGLOBIN: 13.2 g/dL (ref 12.0–15.0)
MCH: 29.4 pg (ref 26.0–34.0)
MCHC: 33 g/dL (ref 30.0–36.0)
MCV: 89.1 fL (ref 78.0–100.0)
Platelets: 349 10*3/uL (ref 150–400)
RBC: 4.49 MIL/uL (ref 3.87–5.11)
RDW: 14.3 % (ref 11.5–15.5)
WBC: 8.3 10*3/uL (ref 4.0–10.5)

## 2018-01-19 LAB — I-STAT BETA HCG BLOOD, ED (MC, WL, AP ONLY): I-stat hCG, quantitative: <5 m[IU]/mL (ref <5)

## 2018-01-19 LAB — URINALYSIS, ROUTINE W REFLEX MICROSCOPIC
BILIRUBIN URINE: NEGATIVE
Glucose, UA: NEGATIVE mg/dL
Hgb urine dipstick: NEGATIVE
KETONES UR: NEGATIVE mg/dL
NITRITE: NEGATIVE
PH: 6 (ref 5.0–8.0)
Protein, ur: NEGATIVE mg/dL
SPECIFIC GRAVITY, URINE: 1.01 (ref 1.005–1.030)

## 2018-01-19 MED ORDER — RANITIDINE HCL 150 MG PO CAPS: 150.0000 mg | ORAL_CAPSULE | Freq: Every day | ORAL | 0 refills | Status: DC
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
## 2018-01-19 NOTE — ED Provider Notes (Signed)
Patient placed in Quick Look pathway, seen and evaluated   Chief Complaint: abdominal pain  HPI:   Pt complains of pain in her right upper abdomen.  Pt seen at Urgent care   ROS: nausea  No cough no chest pain  Physical Exam:   Gen: No distress  Neuro: Awake and Alert  Skin: Warm    Focused Exam: tendeer right upper quadrant   Initiation of care has begun. The patient has been counseled on the process, plan, and necessity for staying for the completion/evaluation, and the remainder of the medical screening examination   Osie CheeksSofia, Tresten Pantoja K, PA-C 01/19/18 1539    Derwood KaplanNanavati, Ankit, MD 01/21/18 [id number]

## 2018-01-19 NOTE — ED Triage Notes (Signed)
Pt reports mid to right side abd pain x 4 days with fever and watery diarrhea. Has nausea, no vomiting. Denies recent travel.

## 2018-01-19 NOTE — Discharge Instructions (Addendum)
Please read attached information. If you experience any new or worsening signs or symptoms please return to the emergency room for evaluation. Please follow-up with your primary care provider or specialist as discussed. Please use medication prescribed only as directed and discontinue taking if you have any concerning signs or symptoms.   °

## 2018-01-19 NOTE — ED Notes (Signed)
Pt in [place]

## 2018-08-03 DIAGNOSIS — J019 Acute sinusitis, unspecified: Secondary | ICD-10-CM | POA: Diagnosis not present

## 2018-08-03 DIAGNOSIS — B9689 Other specified bacterial agents as the cause of diseases classified elsewhere: Secondary | ICD-10-CM | POA: Diagnosis not present

## 2018-08-25 ENCOUNTER — Encounter: Payer: Self-pay | Admitting: Physician Assistant

## 2018-08-25 ENCOUNTER — Ambulatory Visit (INDEPENDENT_AMBULATORY_CARE_PROVIDER_SITE_OTHER): Payer: BLUE CROSS/BLUE SHIELD | Admitting: Physician Assistant

## 2018-08-25 VITALS — BP 98/90 | HR 73 | Temp 98.3°F | Ht 60.0 in | Wt 232.0 lb

## 2018-08-25 DIAGNOSIS — Z13220 Encounter for screening for lipoid disorders: Secondary | ICD-10-CM

## 2018-08-25 DIAGNOSIS — N926 Irregular menstruation, unspecified: Secondary | ICD-10-CM

## 2018-08-25 DIAGNOSIS — Z720 Tobacco use: Secondary | ICD-10-CM | POA: Diagnosis not present

## 2018-08-25 DIAGNOSIS — G4733 Obstructive sleep apnea (adult) (pediatric): Secondary | ICD-10-CM | POA: Diagnosis not present

## 2018-08-25 DIAGNOSIS — Z136 Encounter for screening for cardiovascular disorders: Secondary | ICD-10-CM | POA: Diagnosis not present

## 2018-08-25 DIAGNOSIS — F321 Major depressive disorder, single episode, moderate: Secondary | ICD-10-CM

## 2018-08-25 DIAGNOSIS — Z3041 Encounter for surveillance of contraceptive pills: Secondary | ICD-10-CM

## 2018-08-25 DIAGNOSIS — F419 Anxiety disorder, unspecified: Secondary | ICD-10-CM

## 2018-08-25 DIAGNOSIS — M25572 Pain in left ankle and joints of left foot: Secondary | ICD-10-CM

## 2018-08-25 DIAGNOSIS — G43809 Other migraine, not intractable, without status migrainosus: Secondary | ICD-10-CM

## 2018-08-25 DIAGNOSIS — E669 Obesity, unspecified: Secondary | ICD-10-CM | POA: Diagnosis not present

## 2018-08-25 DIAGNOSIS — Z0001 Encounter for general adult medical examination with abnormal findings: Secondary | ICD-10-CM | POA: Diagnosis not present

## 2018-08-25 DIAGNOSIS — G8929 Other chronic pain: Secondary | ICD-10-CM

## 2018-08-25 LAB — CBC WITH DIFFERENTIAL/PLATELET
BASOS ABS: 0.1 10*3/uL (ref 0.0–0.1)
BASOS PCT: 0.9 % (ref 0.0–3.0)
Eosinophils Absolute: 0.2 10*3/uL (ref 0.0–0.7)
Eosinophils Relative: 2.2 % (ref 0.0–5.0)
HCT: 36.7 % (ref 36.0–46.0)
Hemoglobin: 12 g/dL (ref 12.0–15.0)
LYMPHS ABS: 3.1 10*3/uL (ref 0.7–4.0)
Lymphocytes Relative: 42.8 % (ref 12.0–46.0)
MCHC: 32.7 g/dL (ref 30.0–36.0)
MCV: 84.4 fl (ref 78.0–100.0)
Monocytes Absolute: 0.4 10*3/uL (ref 0.1–1.0)
Monocytes Relative: 5.5 % (ref 3.0–12.0)
NEUTROS ABS: 3.5 10*3/uL (ref 1.4–7.7)
NEUTROS PCT: 48.6 % (ref 43.0–77.0)
Platelets: 391 10*3/uL (ref 150.0–400.0)
RBC: 4.35 Mil/uL (ref 3.87–5.11)
RDW: 15.5 % (ref 11.5–15.5)
WBC: 7.2 10*3/uL (ref 4.0–10.5)

## 2018-08-25 LAB — COMPREHENSIVE METABOLIC PANEL
ALT: 27 U/L (ref 0–35)
AST: 21 U/L (ref 0–37)
Albumin: 4.3 g/dL (ref 3.5–5.2)
Alkaline Phosphatase: 52 U/L (ref 39–117)
BUN: 13 mg/dL (ref 6–23)
CHLORIDE: 104 meq/L (ref 96–112)
CO2: 27 meq/L (ref 19–32)
CREATININE: 0.61 mg/dL (ref 0.40–1.20)
Calcium: 9.3 mg/dL (ref 8.4–10.5)
GFR: 115.09 mL/min (ref >60.00)
GLUCOSE: 101 mg/dL — AB (ref 70–99)
Potassium: 4.3 mEq/L (ref 3.5–5.1)
SODIUM: 138 meq/L (ref 135–145)
Total Bilirubin: 0.4 mg/dL (ref 0.2–1.2)
Total Protein: 7.1 g/dL (ref 6.0–8.3)

## 2018-08-25 LAB — POCT URINE PREGNANCY: Preg Test, Ur: NEGATIVE

## 2018-08-25 LAB — LIPID PANEL
Cholesterol: 190 mg/dL (ref 0–200)
HDL: 34.1 mg/dL — ABNORMAL LOW (ref >39.00)
LDL Cholesterol: 120 mg/dL — ABNORMAL HIGH (ref 0–99)
NONHDL: 155.65
Total CHOL/HDL Ratio: 6
Triglycerides: 177 mg/dL — ABNORMAL HIGH (ref 0.0–149.0)
VLDL: 35.4 mg/dL (ref 0.0–40.0)

## 2018-08-25 LAB — HEMOGLOBIN A1C: HEMOGLOBIN A1C: 6.4 % (ref 4.6–6.5)

## 2018-08-25 LAB — TSH: TSH: 1.49 u[IU]/mL (ref 0.35–4.50)

## 2018-08-25 MED ORDER — HYDROXYZINE HCL 25 MG PO TABS: 25.0000 mg | ORAL_TABLET | Freq: Every evening | ORAL | 0 refills | Status: DC | PRN

## 2018-08-25 MED ORDER — NORETHINDRONE ACET-ETHINYL EST 1-20 MG-MCG PO TABS: 1.0000 | ORAL_TABLET | Freq: Every day | ORAL | 3 refills | Status: DC
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
## 2018-08-25 NOTE — Patient Instructions (Addendum)
It was great to see you!  1. Please contact a therapist. You may take Atarax as needed for anxiety and insomnia. Please follow-up with me in 1 month to touch base on your mood. If you have any significant worsening of your mood, please tell someone and go to the ER. 2. You will be contacted about your referrals to ob-gyn [facility]) and an orthopedic surgeon. You may start oral birth control pills in the meantime. 3. Please talk to your neurologist about getting a sleep study.   Please go to the lab for blood work.   Our office will call you with your results unless you have chosen to receive results via MyChart.  If your blood work is normal we will follow-up each year for physicals and as scheduled for chronic medical problems.  If anything is abnormal we will treat accordingly and get you in for a follow-up.  Take care,  Heidi Ellison [facility] Maintenance, Female Adopting a healthy lifestyle and getting preventive care can go a long way to promote health and wellness. Talk with your health care provider about what schedule of regular examinations is right for you. This is a good chance for you to check in with your provider about disease prevention and staying healthy. In between checkups, there are plenty of things you can do on your own. Experts have done a lot of research about which lifestyle changes and preventive measures are most likely to keep you healthy. Ask your health care provider for more information. Weight and diet Eat a healthy diet  Be sure to include plenty of vegetables, fruits, low-fat dairy products, and lean protein.  Do not eat a lot of foods high in solid fats, added sugars, or salt.  Get regular exercise. This is one of the most important things you can do for your health. ? Most adults should exercise for at least 150 minutes each week. The exercise should increase your heart rate and make you sweat (moderate-intensity exercise). ? Most adults should also  do strengthening exercises at least twice a week. This is in addition to the moderate-intensity exercise. Maintain a healthy weight  Body mass index (BMI) is a measurement that can be used to identify possible weight problems. It estimates body fat based on height and weight. Your health care provider can help determine your BMI and help you achieve or maintain a healthy weight.  For females 20 years of age and older: ? A BMI below 18.5 is considered underweight. ? A BMI of 18.5 to 24.9 is normal. ? A BMI of 25 to 29.9 is considered overweight. ? A BMI of 30 and above is considered obese. Watch levels of cholesterol and blood lipids  You should start having your blood tested for lipids and cholesterol at 30 years of age, then have this test every 5 years.  You may need to have your cholesterol levels checked more often if: ? Your lipid or cholesterol levels are high. ? You are older than 30 years of age. ? You are at high risk for heart disease. Cancer screening Lung Cancer  Lung cancer screening is recommended for adults 4-6 years old who are at high risk for lung cancer because of a history of smoking.  A yearly low-dose CT scan of the lungs is recommended for people who: ? Currently smoke. ? Have quit within the past 15 years. ? Have at least a 30-pack-year history of smoking. A pack year is smoking an average of one  pack of cigarettes a day for 1 year.  Yearly screening should continue until it has been 15 years since you quit.  Yearly screening should stop if you develop a health problem that would prevent you from having lung cancer treatment. Breast Cancer  Practice breast self-awareness. This means understanding how your breasts normally appear and feel.  It also means doing regular breast self-exams. Let your health care provider know about any changes, no matter how small.  If you are in your 20s or 30s, you should have a clinical breast exam (CBE) by a health care  provider every 1-3 years as part of a regular health exam.  If you are 35 or older, have a CBE every year. Also consider having a breast X-ray (mammogram) every year.  If you have a family history of breast cancer, talk to your health care provider about genetic screening.  If you are at high risk for breast cancer, talk to your health care provider about having an MRI and a mammogram every year.  Breast cancer gene (BRCA) assessment is recommended for women who have family members with BRCA-related cancers. BRCA-related cancers include: ? Breast. ? Ovarian. ? Tubal. ? Peritoneal cancers.  Results of the assessment will determine the need for genetic counseling and BRCA1 and BRCA2 testing. Cervical Cancer Your health care provider may recommend that you be screened regularly for cancer of the pelvic organs (ovaries, uterus, and vagina). This screening involves a pelvic examination, including checking for microscopic changes to the surface of your cervix (Pap test). You may be encouraged to have this screening done every 3 years, beginning at age 68.  For women ages 78-65, health care providers may recommend pelvic exams and Pap testing every 3 years, or they may recommend the Pap and pelvic exam, combined with testing for human papilloma virus (HPV), every 5 years. Some types of HPV increase your risk of cervical cancer. Testing for HPV may also be done on women of any age with unclear Pap test results.  Other health care providers may not recommend any screening for nonpregnant women who are considered low risk for pelvic cancer and who do not have symptoms. Ask your health care provider if a screening pelvic exam is right for you.  If you have had past treatment for cervical cancer or a condition that could lead to cancer, you need Pap tests and screening for cancer for at least 20 years after your treatment. If Pap tests have been discontinued, your risk factors (such as having a new sexual  partner) need to be reassessed to determine if screening should resume. Some women have medical problems that increase the chance of getting cervical cancer. In these cases, your health care provider may recommend more frequent screening and Pap tests. Colorectal Cancer  This type of cancer can be detected and often prevented.  Routine colorectal cancer screening usually begins at 30 years of age and continues through 30 years of age.  Your health care provider may recommend screening at an earlier age if you have risk factors for colon cancer.  Your health care provider may also recommend using home test kits to check for hidden blood in the stool.  A small camera at the end of a tube can be used to examine your colon directly (sigmoidoscopy or colonoscopy). This is done to check for the earliest forms of colorectal cancer.  Routine screening usually begins at age 39.  Direct examination of the colon should be repeated every 5-10  years through 30 years of age. However, you may need to be screened more often if early forms of precancerous polyps or small growths are found. Skin Cancer  Check your skin from head to toe regularly.  Tell your health care provider about any new moles or changes in moles, especially if there is a change in a mole's shape or color.  Also tell your health care provider if you have a mole that is larger than the size of a pencil eraser.  Always use sunscreen. Apply sunscreen liberally and repeatedly throughout the day.  Protect yourself by wearing long sleeves, pants, a wide-brimmed hat, and sunglasses whenever you are outside. Heart disease, diabetes, and high blood pressure  High blood pressure causes heart disease and increases the risk of stroke. High blood pressure is more likely to develop in: ? People who have blood pressure in the high end of the normal range (130-139/85-89 mm Hg). ? People who are overweight or obese. ? People who are African  American.  If you are 55-51 years of age, have your blood pressure checked every 3-5 years. If you are 35 years of age or older, have your blood pressure checked every year. You should have your blood pressure measured twice-once when you are at a hospital or clinic, and once when you are not at a hospital or clinic. Record the average of the two measurements. To check your blood pressure when you are not at a hospital or clinic, you can use: ? An automated blood pressure machine at a pharmacy. ? A home blood pressure monitor.  If you are between 46 years and 85 years old, ask your health care provider if you should take aspirin to prevent strokes.  Have regular diabetes screenings. This involves taking a blood sample to check your fasting blood sugar level. ? If you are at a normal weight and have a low risk for diabetes, have this test once every three years after 30 years of age. ? If you are overweight and have a high risk for diabetes, consider being tested at a younger age or more often. Preventing infection Hepatitis B  If you have a higher risk for hepatitis B, you should be screened for this virus. You are considered at high risk for hepatitis B if: ? You were born in a country where hepatitis B is common. Ask your health care provider which countries are considered high risk. ? Your parents were born in a high-risk country, and you have not been immunized against hepatitis B (hepatitis B vaccine). ? You have HIV or AIDS. ? You use needles to inject street drugs. ? You live with someone who has hepatitis B. ? You have had sex with someone who has hepatitis B. ? You get hemodialysis treatment. ? You take certain medicines for conditions, including cancer, organ transplantation, and autoimmune conditions. Hepatitis C  Blood testing is recommended for: ? Everyone born from [birth year] through 1967. ? Anyone with known risk factors for hepatitis C. Sexually transmitted infections  (STIs)  You should be screened for sexually transmitted infections (STIs) including gonorrhea and chlamydia if: ? You are sexually active and are younger than 30 years of age. ? You are older than 30 years of age and your health care provider tells you that you are at risk for this type of infection. ? Your sexual activity has changed since you were last screened and you are at an increased risk for chlamydia or gonorrhea. Ask your health care  provider if you are at risk.  If you do not have HIV, but are at risk, it may be recommended that you take a prescription medicine daily to prevent HIV infection. This is called pre-exposure prophylaxis (PrEP). You are considered at risk if: ? You are sexually active and do not regularly use condoms or know the HIV status of your partner(s). ? You take drugs by injection. ? You are sexually active with a partner who has HIV. Talk with your health care provider about whether you are at high risk of being infected with HIV. If you choose to begin PrEP, you should first be tested for HIV. You should then be tested every 3 months for as long as you are taking PrEP. Pregnancy  If you are premenopausal and you may become pregnant, ask your health care provider about preconception counseling.  If you may become pregnant, take 400 to 800 micrograms (mcg) of folic acid every day.  If you want to prevent pregnancy, talk to your health care provider about birth control (contraception). Osteoporosis and menopause  Osteoporosis is a disease in which the bones lose minerals and strength with aging. This can result in serious bone fractures. Your risk for osteoporosis can be identified using a bone density scan.  If you are 24 years of age or older, or if you are at risk for osteoporosis and fractures, ask your health care provider if you should be screened.  Ask your health care provider whether you should take a calcium or vitamin D supplement to lower your risk  for osteoporosis.  Menopause may have certain physical symptoms and risks.  Hormone replacement therapy may reduce some of these symptoms and risks. Talk to your health care provider about whether hormone replacement therapy is right for you. Follow these instructions at home:  Schedule regular health, dental, and eye exams.  Stay current with your immunizations.  Do not use any tobacco products including cigarettes, chewing tobacco, or electronic cigarettes.  If you are pregnant, do not drink alcohol.  If you are breastfeeding, limit how much and how often you drink alcohol.  Limit alcohol intake to no more than 1 drink per day for nonpregnant women. One drink equals 12 ounces of beer, 5 ounces of wine, or 1 ounces of hard liquor.  Do not use street drugs.  Do not share needles.  Ask your health care provider for help if you need support or information about quitting drugs.  Tell your health care provider if you often feel depressed.  Tell your health care provider if you have ever been abused or do not feel safe at home. This information is not intended to replace advice given to you by your health care provider. Make sure you discuss any questions you have with your health care provider. Document Released: 12/31/2010 Document Revised: 11/23/2015 Document Reviewed: 03/21/2015 Elsevier Interactive Patient Education  2019 [employer].

## 2018-08-27 ENCOUNTER — Ambulatory Visit (INDEPENDENT_AMBULATORY_CARE_PROVIDER_SITE_OTHER): Payer: BLUE CROSS/BLUE SHIELD

## 2018-08-27 ENCOUNTER — Encounter (INDEPENDENT_AMBULATORY_CARE_PROVIDER_SITE_OTHER): Payer: Self-pay | Admitting: Orthopedic Surgery

## 2018-08-27 ENCOUNTER — Ambulatory Visit (INDEPENDENT_AMBULATORY_CARE_PROVIDER_SITE_OTHER): Payer: BLUE CROSS/BLUE SHIELD | Admitting: Orthopedic Surgery

## 2018-08-27 ENCOUNTER — Encounter: Payer: Self-pay | Admitting: Physician Assistant

## 2018-08-27 VITALS — Ht 60.0 in | Wt 232.0 lb

## 2018-08-27 DIAGNOSIS — M25571 Pain in right ankle and joints of right foot: Secondary | ICD-10-CM | POA: Diagnosis not present

## 2018-08-27 DIAGNOSIS — M19171 Post-traumatic osteoarthritis, right ankle and foot: Secondary | ICD-10-CM

## 2018-08-27 DIAGNOSIS — M79672 Pain in left foot: Secondary | ICD-10-CM

## 2018-08-27 DIAGNOSIS — M79671 Pain in right foot: Secondary | ICD-10-CM

## 2018-08-27 DIAGNOSIS — M19172 Post-traumatic osteoarthritis, left ankle and foot: Secondary | ICD-10-CM | POA: Diagnosis not present

## 2018-08-27 DIAGNOSIS — M25572 Pain in left ankle and joints of left foot: Secondary | ICD-10-CM | POA: Diagnosis not present

## 2018-08-28 ENCOUNTER — Telehealth (INDEPENDENT_AMBULATORY_CARE_PROVIDER_SITE_OTHER): Payer: Self-pay

## 2018-08-28 ENCOUNTER — Telehealth (INDEPENDENT_AMBULATORY_CARE_PROVIDER_SITE_OTHER): Payer: Self-pay | Admitting: Orthopedic Surgery

## 2018-08-28 NOTE — Telephone Encounter (Signed)
Patient was called was informed that she should elevate her legs above the heart and apply ice to bil ankles due to swelling and if her symptoms worsen than she can go to ER and also follow-up with Dr Lajoyce Corners. She was injected with cortisone bil ankle inj on 08/27/2018 and was concerned if these inj was causing her pain. I informed her to also alternate Tylenol and Ibuprofen if needed.

## 2018-08-28 NOTE — Telephone Encounter (Signed)
Patient called advised she has swelling and pain in both ankles and want to be sure this is normal. Patient want to know if it is normal how long will the swelling and pain last? Patient asked what can she do to help with the swelling and pain?  The number to contact patient is [phone]

## 2018-08-31 ENCOUNTER — Encounter (INDEPENDENT_AMBULATORY_CARE_PROVIDER_SITE_OTHER): Payer: Self-pay | Admitting: Orthopedic Surgery

## 2018-08-31 DIAGNOSIS — M19172 Post-traumatic osteoarthritis, left ankle and foot: Secondary | ICD-10-CM

## 2018-08-31 DIAGNOSIS — M25572 Pain in left ankle and joints of left foot: Secondary | ICD-10-CM

## 2018-08-31 DIAGNOSIS — M19171 Post-traumatic osteoarthritis, right ankle and foot: Secondary | ICD-10-CM

## 2018-08-31 DIAGNOSIS — M25571 Pain in right ankle and joints of right foot: Secondary | ICD-10-CM

## 2018-08-31 HISTORY — DX: Post-traumatic osteoarthritis, right ankle and foot: M19.171

## 2018-08-31 HISTORY — DX: Post-traumatic osteoarthritis, left ankle and foot: M19.172

## 2018-08-31 MED ORDER — METFORMIN HCL 500 MG PO TABS: 500.0000 mg | ORAL_TABLET | Freq: Every day | ORAL | 2 refills | Status: DC
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
## 2018-09-07 ENCOUNTER — Encounter (INDEPENDENT_AMBULATORY_CARE_PROVIDER_SITE_OTHER): Payer: Self-pay | Admitting: Orthopedic Surgery

## 2018-09-22 ENCOUNTER — Telehealth (INDEPENDENT_AMBULATORY_CARE_PROVIDER_SITE_OTHER): Payer: Self-pay

## 2018-09-22 NOTE — Telephone Encounter (Signed)
Patient was called and screened beforehand and stated that she has traveled to [place] 3 weeks ago but does not have any symptoms or anyone in her family.

## 2018-09-24 ENCOUNTER — Ambulatory Visit (INDEPENDENT_AMBULATORY_CARE_PROVIDER_SITE_OTHER): Payer: BLUE CROSS/BLUE SHIELD | Admitting: Orthopedic Surgery

## 2018-09-24 ENCOUNTER — Encounter (INDEPENDENT_AMBULATORY_CARE_PROVIDER_SITE_OTHER): Payer: Self-pay | Admitting: Orthopedic Surgery

## 2018-09-24 ENCOUNTER — Other Ambulatory Visit: Payer: Self-pay

## 2018-09-24 VITALS — Ht 60.0 in | Wt 232.0 lb

## 2018-09-24 DIAGNOSIS — T85848A Pain due to other internal prosthetic devices, implants and grafts, initial encounter: Secondary | ICD-10-CM | POA: Insufficient documentation

## 2018-09-24 DIAGNOSIS — T85848S Pain due to other internal prosthetic devices, implants and grafts, sequela: Secondary | ICD-10-CM | POA: Diagnosis not present

## 2018-09-24 DIAGNOSIS — M19171 Post-traumatic osteoarthritis, right ankle and foot: Secondary | ICD-10-CM | POA: Diagnosis not present

## 2018-09-24 DIAGNOSIS — M19172 Post-traumatic osteoarthritis, left ankle and foot: Secondary | ICD-10-CM | POA: Diagnosis not present

## 2018-09-24 HISTORY — DX: Pain due to other internal prosthetic devices, implants and grafts, initial encounter: T85.848A
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
## 2018-10-07 ENCOUNTER — Ambulatory Visit (INDEPENDENT_AMBULATORY_CARE_PROVIDER_SITE_OTHER): Payer: BLUE CROSS/BLUE SHIELD | Admitting: Physician Assistant

## 2018-10-07 ENCOUNTER — Ambulatory Visit: Payer: Self-pay | Admitting: *Deleted

## 2018-10-07 ENCOUNTER — Other Ambulatory Visit: Payer: Self-pay

## 2018-10-07 ENCOUNTER — Encounter: Payer: Self-pay | Admitting: Physician Assistant

## 2018-10-07 ENCOUNTER — Emergency Department (HOSPITAL_BASED_OUTPATIENT_CLINIC_OR_DEPARTMENT_OTHER): Payer: BLUE CROSS/BLUE SHIELD

## 2018-10-07 ENCOUNTER — Emergency Department (HOSPITAL_BASED_OUTPATIENT_CLINIC_OR_DEPARTMENT_OTHER)
Admission: EM | Admit: 2018-10-07 | Discharge: 2018-10-07 | Disposition: A | Discharge status: Home / Self Care | Payer: BLUE CROSS/BLUE SHIELD | Attending: Emergency Medicine | Admitting: Emergency Medicine

## 2018-10-07 ENCOUNTER — Encounter (HOSPITAL_BASED_OUTPATIENT_CLINIC_OR_DEPARTMENT_OTHER): Payer: Self-pay

## 2018-10-07 DIAGNOSIS — R0789 Other chest pain: Secondary | ICD-10-CM

## 2018-10-07 DIAGNOSIS — F17210 Nicotine dependence, cigarettes, uncomplicated: Secondary | ICD-10-CM | POA: Diagnosis not present

## 2018-10-07 DIAGNOSIS — R0602 Shortness of breath: Secondary | ICD-10-CM

## 2018-10-07 DIAGNOSIS — R079 Chest pain, unspecified: Secondary | ICD-10-CM | POA: Diagnosis present

## 2018-10-07 DIAGNOSIS — Z7984 Long term (current) use of oral hypoglycemic drugs: Secondary | ICD-10-CM | POA: Diagnosis not present

## 2018-10-07 DIAGNOSIS — Z79899 Other long term (current) drug therapy: Secondary | ICD-10-CM | POA: Insufficient documentation

## 2018-10-07 DIAGNOSIS — J00 Acute nasopharyngitis [common cold]: Secondary | ICD-10-CM | POA: Diagnosis not present

## 2018-10-07 LAB — CBC WITH DIFFERENTIAL/PLATELET
Abs Immature Granulocytes: 0.04 10*3/uL (ref 0.00–0.07)
Basophils Absolute: 0.1 10*3/uL (ref 0.0–0.1)
Basophils Relative: 1 %
Eosinophils Absolute: 0.1 10*3/uL (ref 0.0–0.5)
Eosinophils Relative: 1 %
HCT: 38.1 % (ref 36.0–46.0)
Hemoglobin: 12.3 g/dL (ref 12.0–15.0)
Immature Granulocytes: 0 %
Lymphocytes Relative: 31 %
Lymphs Abs: 3.5 10*3/uL (ref 0.7–4.0)
MCH: 27.8 pg (ref 26.0–34.0)
MCHC: 32.3 g/dL (ref 30.0–36.0)
MCV: 86 fL (ref 80.0–100.0)
Monocytes Absolute: 1 10*3/uL (ref 0.1–1.0)
Monocytes Relative: 9 %
Neutro Abs: 6.5 10*3/uL (ref 1.7–7.7)
Neutrophils Relative %: 58 %
Platelets: 416 10*3/uL — ABNORMAL HIGH (ref 150–400)
RBC: 4.43 MIL/uL (ref 3.87–5.11)
RDW: 16.1 % — ABNORMAL HIGH (ref 11.5–15.5)
WBC: 11.3 10*3/uL — ABNORMAL HIGH (ref 4.0–10.5)
nRBC: 0 % (ref 0.0–0.2)

## 2018-10-07 LAB — BASIC METABOLIC PANEL
Anion gap: 6 (ref 5–15)
BUN: 8 mg/dL (ref 6–20)
CO2: 21 mmol/L — ABNORMAL LOW (ref 22–32)
Calcium: 9.3 mg/dL (ref 8.9–10.3)
Chloride: 106 mmol/L (ref 98–111)
Creatinine, Ser: 0.6 mg/dL (ref 0.44–1.00)
GFR calc Af Amer: >60 mL/min (ref >60)
GFR calc non Af Amer: >60 mL/min (ref >60)
Glucose, Bld: 86 mg/dL (ref 70–99)
Potassium: 3.8 mmol/L (ref 3.5–5.1)
Sodium: 133 mmol/L — ABNORMAL LOW (ref 135–145)

## 2018-10-07 LAB — D-DIMER, QUANTITATIVE (NOT AT ARMC): D-Dimer, Quant: 0.37 ug/mL-FEU (ref 0.00–0.50)

## 2018-10-07 LAB — TROPONIN I: Troponin I: <0.03 ng/mL (ref <0.03)

## 2018-10-07 MED ORDER — FAMOTIDINE 20 MG PO TABS: 20.0000 mg | ORAL_TABLET | Freq: Two times a day (BID) | ORAL | 0 refills | Status: DC

## 2018-10-07 MED ORDER — ALUM & MAG HYDROXIDE-SIMETH 200-200-20 MG/5ML PO SUSP
30.0000 mL | Freq: Once | ORAL | Status: AC
Start: 2018-10-07 — End: 2018-10-07
  Administered 2018-10-07: 30 mL via ORAL
  Filled 2018-10-07: qty 30

## 2018-10-07 MED ORDER — LIDOCAINE VISCOUS HCL 2 % MT SOLN
15.0000 mL | Freq: Once | OROMUCOSAL | Status: AC
  Administered 2018-10-07: 15 mL via ORAL
  Filled 2018-10-07: qty 15

## 2018-10-07 NOTE — ED Triage Notes (Addendum)
Pt sent by pmd for evaluation of cold symptoms, sore throat, chest tightness since this morning.  Afebrile, denies cough.  Mask provided on arrival.  Pt reports exertional shortness of breath, sat 100% RA

## 2018-10-07 NOTE — Discharge Instructions (Signed)
Try to avoid ibuprofen, or Aleve-like products.  You were also given a printout of food choices for reflux.  In the next few days if you develop fever or cough and cold-like symptoms and those continue to worsen return for repeat evaluation.

## 2018-10-07 NOTE — ED Triage Notes (Signed)
Pt also reports pain right leg and new swelling.  Pt has been started on metformin 3 months ago, denies any issues with medication.

## 2018-10-07 NOTE — Telephone Encounter (Signed)
See triage note.
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
## 2018-10-07 NOTE — Progress Notes (Signed)
Virtual Visit via Video   I connected with Heidi FerryVanessa M Ellison on 10/07/18 at  1:20 PM EDT by a video enabled telemedicine application and verified that I am speaking with the correct person using two identifiers. Location patient: Home Location provider: [facility], Office Persons participating in the virtual visit: Heidi SiadVanessa M Ellison, Heidi Ellison, [place], Heidi Ellison, [place].  I discussed the limitations of evaluation and management by telemedicine and the availability of in person appointments. The patient expressed understanding and agreed to proceed.  Subjective:   HPI:  Chest Tightness Pt c/o chest tightness and shortness of breath this morning. States that her chest feels heavy. She denies sore throat but feels like right tonsil is swollen and tingly, a feeling that makes her want to cough. She feels pressure in her sinuses like she may be catching a cold.  Patient reports that the tightness in her chest woke her up out of her sleep.  She states that it is constant and is in the middle of her chest.   She states that the pain is radiating to her back. She feels like there is pressure there and when she walks around or does any activity it is worsened.  She is currently smoking about 1 to 2 cigarettes/day.   She is obese and is on oral contraceptives.  She denies fever, chills, headaches.  When asked about calf pain she does endorse right calf pain with slight swelling over the past few days.  Denies any recent immobilization.  She is currently undergoing work-up for posttraumatic osteoarthritis of her ankles from prior surgeries and has been receiving steroid injections.  She does state that her anxiety is presently an 8 out of 10.  She lives with her sister and her niece and both of them are healthy without symptoms.  Denies: fever, headache or body aches  Patient's last menstrual period was 09/17/2018.  ROS: See pertinent positives and negatives per HPI.  Patient Active Problem  List   Diagnosis Date Noted  . Pain from implanted hardware 09/24/2018  . Post-traumatic osteoarthritis of both ankles 08/31/2018  . Endometriosis 10/24/2017  . Tobacco abuse 10/22/2017  . Arthritis 08/01/2017  . Chronic migraine 04/22/2017  . Migraine without status migrainosus, not intractable 01/11/2016    Social History   Tobacco Use  . Smoking status: Current Some Day Smoker    Types: Cigarettes  . Smokeless tobacco: Never Used  . Tobacco comment: 1-2 cigarettes per day  Substance Use Topics  . Alcohol use: Yes    Comment: Occasionally    Current Outpatient Medications:  .  aspirin-acetaminophen-caffeine (EXCEDRIN MIGRAINE) 250-250-65 MG tablet, Take 2 tablets by mouth every 6 (six) hours as needed for headache., Disp: , Rfl:  .  hydrOXYzine (ATARAX/VISTARIL) 25 MG tablet, Take 1 tablet (25 mg total) by mouth at bedtime as needed for anxiety (insomnia). Take one 25 mg tablet 30-60 minutes prior to bedtime for insomnia, anxiety. May increase to two tablets., Disp: 60 tablet, Rfl: 0 .  ibuprofen (ADVIL,MOTRIN) 200 MG tablet, Take 400 mg by mouth every 6 (six) hours as needed for fever or headache (pain). , Disp: , Rfl:  .  metFORMIN (GLUCOPHAGE) 500 MG tablet, Take 1 tablet (500 mg total) by mouth daily with breakfast., Disp: 30 tablet, Rfl: 2 .  norethindrone-ethinyl estradiol (MICROGESTIN,JUNEL,LOESTRIN) 1-20 MG-MCG tablet, Take 1 tablet by mouth daily., Disp: 1 Package, Rfl: 3  No Known Allergies  Objective:   VITALS: Per patient if applicable, see vitals. GENERAL:  Alert, appears well and in no acute distress. HEENT: Atraumatic, conjunctiva clear, no obvious abnormalities on inspection of external nose and ears. NECK: Normal movements of the head and neck. CARDIOPULMONARY: No increased WOB. Speaking in clear sentences. I:E ratio WNL.  MS: Moves all visible extremities without noticeable abnormality. PSYCH: Pleasant and cooperative, well-groomed. Speech normal rate and  rhythm. Affect is appropriate. Insight and judgement are appropriate. Attention is focused, linear, and appropriate.  NEURO: CN grossly intact. Oriented as arrived to appointment on time with no prompting. Moves both UE equally.  SKIN: No obvious lesions, wounds, erythema, or cyanosis noted on face or hands.  Assessment and Plan:   [name] was seen today for chest pain and shortness of breath.  Diagnoses and all orders for this visit:  Chest pressure  SOB (shortness of breath)   After reviewing patient's case with Dr. Helane Rima, feel patient would best be served in the ER setting to further work-up.  Patient does have risk factors for DVT.  Patient verbalized understanding to plan.  I did recommend that she go to med [facility], rather than 1 of the larger hospitals, given the current pandemic.  Patient verbalized understanding to plan, and stated that she was going to proceed to the ER immediately after our phone call.  . Reviewed expectations re: course of current medical issues. . Discussed self-management of symptoms. . Outlined signs and symptoms indicating need for more acute intervention. . Patient verbalized understanding and all questions were answered. Marland Kitchen Health Maintenance issues including appropriate healthy diet, exercise, and smoking avoidance were discussed with patient. . See orders for this visit as documented in the electronic medical record.  I discussed the assessment and treatment plan with the patient. The patient was provided an opportunity to ask questions and all were answered. The patient agreed with the plan and demonstrated an understanding of the instructions.   The patient was advised to call back or seek an in-person evaluation if the symptoms worsen or if the condition fails to improve as anticipated.  CMA or LPN served as scribe during this visit. History, Physical, and Plan performed by medical provider. The above documentation has been reviewed  and is accurate and complete.   [place], [place] 10/07/2018

## 2018-10-07 NOTE — Telephone Encounter (Signed)
Pt c/o Chest tightness and shortness of breath this morning. States that her chest feels heavy. No sore throat but feels like right tonsil is swollen and tingly, a feeling that makes her want to cough. No fever, headache or body aches. And has not been around anyone that is sick. She feels pressure in her sinuses like she she may be catching a cold. Advised that the office is doing virtual visits and she will be able to speak with someone regarding this. Her e mail address is   [email or web address] Notified flow at [facility] at [place] and conference the call. Routing to office   Reason for Disposition . MILD difficulty breathing (e.g., minimal/no SOB at rest, SOB with walking, pulse <100)  Answer Assessment - Initial Assessment Questions 1. LOCATION: "Where does it hurt?"       No pain 2. RADIATION: "Does the pain go anywhere else?" (e.g., into neck, jaw, arms, back)     Chest tightness 3. ONSET: "When did the chest pain begin?" (Minutes, hours or days)      Chest tightness this morning 4. PATTERN "Does the pain come and go, or has it been constant since it started?"  "Does it get worse with exertion?"      constant 5. DURATION: "How long does it last" (e.g., seconds, minutes, hours)     constant 6. SEVERITY: "How bad is the pain?"  (e.g., Scale 1-10; mild, moderate, or severe)    - MILD (1-3): doesn't interfere with normal activities     - MODERATE (4-7): interferes with normal activities or awakens from sleep    - SEVERE (8-10): excruciating pain, unable to do any normal activities       Chest tightness 7. CARDIAC RISK FACTORS: "Do you have any history of heart problems or risk factors for heart disease?" (e.g., prior heart attack, angina; high blood pressure, diabetes, being overweight, high cholesterol, smoking, or strong family history of heart disease)    Pre diabetes, family history, smoke, over weight 8. PULMONARY RISK FACTORS: "Do you have any history of lung disease?"   (e.g., blood clots in lung, asthma, emphysema, birth control pills)    Birth control pills 9. CAUSE: "What do you think is causing the chest pain?"     Not sure what causing the chest tightness 10. OTHER SYMPTOMS: "Do you have any other symptoms?" (e.g., dizziness, nausea, vomiting, sweating, fever, difficulty breathing, cough)       Shortness of breath,  11. PREGNANCY: "Is there any chance you are pregnant?" "When was your last menstrual period?"       LMP 03/19 -03/23. Not pregnant  Protocols used: CORONAVIRUS (COVID-19) DIAGNOSED OR SUSPECTED-A-AH, CHEST PAIN-A-AH

## 2018-10-08 ENCOUNTER — Encounter: Payer: Self-pay | Admitting: Physician Assistant

## 2018-10-08 NOTE — Telephone Encounter (Signed)
Left message on voicemail to call office.  

## 2018-10-08 NOTE — Telephone Encounter (Signed)
Pt called back, asked her what is going on today? Pt said went to ER yesterday and now today having lightheadedness and body aches, feels like a truck hit her. Asked pt if she is drinking water? Pt said yes. Asked pt if any fever, chills nausea or vomiting? Pt denies fever or chills temp 98.6 while on phone. Having some nausea no vomiting. Told pt you could be dealing with a virus, and definitely needs to stay home and rest, unless concerning symptoms, then needs to go back to the ER per [facility]. Pt verbalized understanding. Asked pt any increase in Chest pain or SOB? Pt said no. Asked pt if ER gave her any medicine yesterday for the reflux? Pt said yes, but has not picked it up yet. Told pt need to start medication and monitor symptoms, drink plenty of water, rest, eat a bland diet stay and practice social distancing. Any increase in symptoms needs to go to the ER. Pt verbalized understanding. Told pt I will call and check on her Monday morning. Pt verbalized understanding.

## 2018-10-12 NOTE — Telephone Encounter (Signed)
Spoke to pt asked her how she is feeling today? Pt said she is better but has a headache. Asked pt if she took anything for it? Pt said Tylenol. Asked pt if she picked up medication Pepcid that the ER gave her. Pt said yes she started it. Asked pt if chest pain and SOB or better. Pt said yes SOB is gone still having some chest discomfort but is better. Asked pt any fever , chills or nausea. Pt denied all symptoms. Told pt I am glad she is feeling but if anything changes please let us know. Pt verbalized understanding.

## 2018-10-29 ENCOUNTER — Encounter: Payer: Self-pay | Admitting: Physician Assistant

## 2018-10-29 ENCOUNTER — Ambulatory Visit: Payer: Self-pay | Admitting: *Deleted

## 2018-10-29 ENCOUNTER — Ambulatory Visit (INDEPENDENT_AMBULATORY_CARE_PROVIDER_SITE_OTHER): Payer: BLUE CROSS/BLUE SHIELD | Admitting: Physician Assistant

## 2018-10-29 DIAGNOSIS — Z7189 Other specified counseling: Secondary | ICD-10-CM | POA: Diagnosis not present

## 2018-10-29 NOTE — Progress Notes (Signed)
Virtual Visit via Video   I connected with Heidi FerryVanessa M Ruffalo on 10/29/18 at  1:00 PM EDT by a video enabled telemedicine application and verified that I am speaking with the correct person using two identifiers. Location patient: Home Location provider: [facility], Office Persons participating in the virtual visit: Collie SiadVanessa M Macha, Jamion Carter, [place]   I discussed the limitations of evaluation and management by telemedicine and the availability of in person appointments. The patient expressed understanding and agreed to proceed.  Subjective:   HPI:   Patient reports that her cousin was recently told that she was a "COVID carrier." She had rapid testing that was negative, but she thinks that she may have had a positive antibody test? Patient herself has no symptoms; denies: chest pain, SOB, fever, cough, body aches.  She would like COVID testing since she has been around her cousin.  ROS: See pertinent positives and negatives per HPI.  Patient Active Problem List   Diagnosis Date Noted  . Pain from implanted hardware 09/24/2018  . Post-traumatic osteoarthritis of both ankles 08/31/2018  . Endometriosis 10/24/2017  . Tobacco abuse 10/22/2017  . Arthritis 08/01/2017  . Chronic migraine 04/22/2017  . Migraine without status migrainosus, not intractable 01/11/2016    Social History   Tobacco Use  . Smoking status: Current Some Day Smoker    Types: Cigarettes  . Smokeless tobacco: Never Used  . Tobacco comment: 1-2 cigarettes per day  Substance Use Topics  . Alcohol use: Yes    Comment: Occasionally    Current Outpatient Medications:  .  aspirin-acetaminophen-caffeine (EXCEDRIN MIGRAINE) 250-250-65 MG tablet, Take 2 tablets by mouth every 6 (six) hours as needed for headache., Disp: , Rfl:  .  famotidine (PEPCID) 20 MG tablet, Take 1 tablet (20 mg total) by mouth 2 (two) times daily., Disp: 28 tablet, Rfl: 0 .  hydrOXYzine (ATARAX/VISTARIL) 25 MG tablet, Take 1 tablet  (25 mg total) by mouth at bedtime as needed for anxiety (insomnia). Take one 25 mg tablet 30-60 minutes prior to bedtime for insomnia, anxiety. May increase to two tablets., Disp: 60 tablet, Rfl: 0 .  ibuprofen (ADVIL,MOTRIN) 200 MG tablet, Take 400 mg by mouth every 6 (six) hours as needed for fever or headache (pain). , Disp: , Rfl:  .  metFORMIN (GLUCOPHAGE) 500 MG tablet, Take 1 tablet (500 mg total) by mouth daily with breakfast., Disp: 30 tablet, Rfl: 2 .  norethindrone-ethinyl estradiol (MICROGESTIN,JUNEL,LOESTRIN) 1-20 MG-MCG tablet, Take 1 tablet by mouth daily., Disp: 1 Package, Rfl: 3  No Known Allergies  Objective:   VITALS: Per patient if applicable, see vitals. GENERAL: Alert, appears well and in no acute distress. HEENT: Atraumatic, conjunctiva clear, no obvious abnormalities on inspection of external nose and ears. NECK: Normal movements of the head and neck. CARDIOPULMONARY: No increased WOB. Speaking in clear sentences. I:E ratio WNL.  MS: Moves all visible extremities without noticeable abnormality. PSYCH: Pleasant and cooperative, well-groomed. Speech normal rate and rhythm. Affect is appropriate. Insight and judgement are appropriate. Attention is focused, linear, and appropriate.  NEURO: CN grossly intact. Oriented as arrived to appointment on time with no prompting. Moves both UE equally.  SKIN: No obvious lesions, wounds, erythema, or cyanosis noted on face or hands.  Assessment and Plan:   Diagnoses and all orders for this visit:  Advice Given About Covid-19 Virus Infection   She is requesting rapid testing. Discussed that we currently do not offer this but I did provide her with locations  that are testing. She states that she will get tested and report her results back to [place].   . Reviewed expectations re: course of current medical issues. . Discussed self-management of symptoms. . Outlined signs and symptoms indicating need for more acute intervention. .  Patient verbalized understanding and all questions were answered. Marland Kitchen Health Maintenance issues including appropriate healthy diet, exercise, and smoking avoidance were discussed with patient. . See orders for this visit as documented in the electronic medical record.  I discussed the assessment and treatment plan with the patient. The patient was provided an opportunity to ask questions and all were answered. The patient agreed with the plan and demonstrated an understanding of the instructions.   The patient was advised to call back or seek an in-person evaluation if the symptoms worsen or if the condition fails to improve as anticipated.    [place], [place] 10/29/2018

## 2018-10-29 NOTE — Telephone Encounter (Signed)
I scheduled patient for 1pm today

## 2018-10-29 NOTE — Telephone Encounter (Signed)
Pt states her cousin developed severe SOB Tuesday; taken to [facility] hospital and tested for Covid-19. States cousin was called this AM, resulted she was "A carrier." States 45 mins later received call "Test was negative." Pt was calling to see if she could be tested as she was in close contact with cousin Tuesday during episode. Reviewed protocols. Advised to first have cousins results verified. Pt is asymptomatic. Advised self quarantine and precautions would be the same if her cousin did test positive. States she will have cousin call [facility] to verify results. Instructed pt to monitor temp, monitor for cough, SOB. If severe symptoms present, go to ED. Advised pt to CB if additional questions arise. Pt verbalizes understanding of disposition and self quarantine if necessary. CB# [phone]  Reason for Disposition . COVID-19 Testing, questions about  Answer Assessment - Initial Assessment Questions 1. COVID-19 DIAGNOSIS: "Who made your Coronavirus (COVID-19) diagnosis?" "Was it confirmed by a positive lab test?" If not diagnosed by a HCP, ask "Are there lots of cases (community spread) where you live?" (See public health department website, if unsure)   * MAJOR community spread: high number of cases; numbers of cases are increasing; many people hospitalized.   * MINOR community spread: low number of cases; not increasing; few or no people hospitalized     Unsure 2. ONSET: "When did the COVID-19 symptoms start?"      *No Answer* 3. WORST SYMPTOM: "What is your worst symptom?" (e.g., cough, fever, shortness of breath, muscle aches)     *No Answer* 4. COUGH: "How bad is the cough?"       *No Answer* 5. FEVER: "Do you have a fever?" If so, ask: "What is your temperature, how was it measured, and when did it start?"     *No Answer* 6. RESPIRATORY STATUS: "Describe your breathing?" (e.g., shortness of breath, wheezing, unable to speak)      *No Answer* 7. BETTER-SAME-WORSE: "Are you getting better, staying  the same or getting worse compared to yesterday?"  If getting worse, ask, "In what way?"     *No Answer* 8. HIGH RISK DISEASE: "Do you have any chronic medical problems?" (e.g., asthma, heart or lung disease, weak immune system, etc.)     *No Answer* 9. PREGNANCY: "Is there any chance you are pregnant?" "When was your last menstrual period?"     *No Answer* 10. OTHER SYMPTOMS: "Do you have any other symptoms?"  (e.g., runny nose, headache, sore throat, loss of smell)      No symptoms  Protocols used: CORONAVIRUS (COVID-19) DIAGNOSED OR SUSPECTED-A-AH

## 2018-10-31 DIAGNOSIS — J029 Acute pharyngitis, unspecified: Secondary | ICD-10-CM | POA: Diagnosis not present

## 2018-10-31 DIAGNOSIS — Z20828 Contact with and (suspected) exposure to other viral communicable diseases: Secondary | ICD-10-CM | POA: Diagnosis not present

## 2018-11-05 LAB — RESULTS CONSOLE HPV: CHL HPV: NEGATIVE

## 2018-11-09 ENCOUNTER — Encounter: Payer: Self-pay | Admitting: Orthopedic Surgery

## 2018-11-24 ENCOUNTER — Other Ambulatory Visit: Payer: Self-pay

## 2018-11-24 ENCOUNTER — Encounter (HOSPITAL_BASED_OUTPATIENT_CLINIC_OR_DEPARTMENT_OTHER): Payer: Self-pay | Admitting: *Deleted

## 2018-11-25 ENCOUNTER — Encounter (HOSPITAL_BASED_OUTPATIENT_CLINIC_OR_DEPARTMENT_OTHER)
Admission: RE | Admit: 2018-11-25 | Discharge: 2018-11-25 | Disposition: A | Discharge status: Home / Self Care | Payer: BLUE CROSS/BLUE SHIELD | Source: Ambulatory Visit | Attending: Orthopedic Surgery | Admitting: Orthopedic Surgery

## 2018-11-25 DIAGNOSIS — Z01419 Encounter for gynecological examination (general) (routine) without abnormal findings: Secondary | ICD-10-CM | POA: Diagnosis not present

## 2018-11-25 DIAGNOSIS — Z3202 Encounter for pregnancy test, result negative: Secondary | ICD-10-CM | POA: Diagnosis not present

## 2018-11-25 DIAGNOSIS — Z6841 Body Mass Index (BMI) 40.0 and over, adult: Secondary | ICD-10-CM | POA: Diagnosis not present

## 2018-11-25 DIAGNOSIS — Z01812 Encounter for preprocedural laboratory examination: Secondary | ICD-10-CM | POA: Insufficient documentation

## 2018-11-25 DIAGNOSIS — N760 Acute vaginitis: Secondary | ICD-10-CM | POA: Diagnosis not present

## 2018-11-25 DIAGNOSIS — N939 Abnormal uterine and vaginal bleeding, unspecified: Secondary | ICD-10-CM | POA: Diagnosis not present

## 2018-11-25 DIAGNOSIS — Z113 Encounter for screening for infections with a predominantly sexual mode of transmission: Secondary | ICD-10-CM | POA: Diagnosis not present

## 2018-11-25 LAB — BASIC METABOLIC PANEL
Anion gap: 8 (ref 5–15)
BUN: 8 mg/dL (ref 6–20)
CO2: 21 mmol/L — ABNORMAL LOW (ref 22–32)
Calcium: 9.6 mg/dL (ref 8.9–10.3)
Chloride: 108 mmol/L (ref 98–111)
Creatinine, Ser: 0.52 mg/dL (ref 0.44–1.00)
GFR calc Af Amer: >60 mL/min (ref >60)
GFR calc non Af Amer: >60 mL/min (ref >60)
Glucose, Bld: 120 mg/dL — ABNORMAL HIGH (ref 70–99)
Potassium: 4.7 mmol/L (ref 3.5–5.1)
Sodium: 137 mmol/L (ref 135–145)

## 2018-11-25 LAB — RESULTS CONSOLE HPV: CHL HPV: NEGATIVE

## 2018-11-25 LAB — HM PAP SMEAR

## 2018-11-25 NOTE — Progress Notes (Signed)
Gatorade 2 given with instructions to complete by 0400 dos, pt verbalized understanding.

## 2018-11-27 ENCOUNTER — Other Ambulatory Visit (HOSPITAL_COMMUNITY)
Admission: RE | Admit: 2018-11-27 | Discharge: 2018-11-27 | Disposition: A | Discharge status: Home / Self Care | Payer: BLUE CROSS/BLUE SHIELD | Source: Ambulatory Visit | Attending: Orthopedic Surgery | Admitting: Orthopedic Surgery

## 2018-11-27 DIAGNOSIS — Z1159 Encounter for screening for other viral diseases: Secondary | ICD-10-CM | POA: Insufficient documentation

## 2018-11-28 LAB — NOVEL CORONAVIRUS, NAA (HOSP ORDER, SEND-OUT TO REF LAB; TAT 18-24 HRS): SARS-CoV-2, NAA: NOT DETECTED

## 2018-12-01 ENCOUNTER — Ambulatory Visit (HOSPITAL_BASED_OUTPATIENT_CLINIC_OR_DEPARTMENT_OTHER): Payer: Self-pay | Admitting: Anesthesiology

## 2018-12-01 ENCOUNTER — Encounter (HOSPITAL_BASED_OUTPATIENT_CLINIC_OR_DEPARTMENT_OTHER)
Admission: RE | Disposition: A | Discharge status: Home / Self Care | Payer: Self-pay | Source: Home / Self Care | Attending: Orthopedic Surgery

## 2018-12-01 ENCOUNTER — Other Ambulatory Visit: Payer: Self-pay

## 2018-12-01 ENCOUNTER — Ambulatory Visit (HOSPITAL_BASED_OUTPATIENT_CLINIC_OR_DEPARTMENT_OTHER)
Admission: RE | Admit: 2018-12-01 | Discharge: 2018-12-01 | Disposition: A | Discharge status: Home / Self Care | Payer: Self-pay | Attending: Orthopedic Surgery | Admitting: Orthopedic Surgery

## 2018-12-01 ENCOUNTER — Encounter (HOSPITAL_BASED_OUTPATIENT_CLINIC_OR_DEPARTMENT_OTHER): Payer: Self-pay | Admitting: Certified Registered Nurse Anesthetist

## 2018-12-01 DIAGNOSIS — T8484XA Pain due to internal orthopedic prosthetic devices, implants and grafts, initial encounter: Secondary | ICD-10-CM | POA: Insufficient documentation

## 2018-12-01 DIAGNOSIS — G4730 Sleep apnea, unspecified: Secondary | ICD-10-CM | POA: Insufficient documentation

## 2018-12-01 DIAGNOSIS — M19071 Primary osteoarthritis, right ankle and foot: Secondary | ICD-10-CM | POA: Insufficient documentation

## 2018-12-01 DIAGNOSIS — F17210 Nicotine dependence, cigarettes, uncomplicated: Secondary | ICD-10-CM | POA: Insufficient documentation

## 2018-12-01 DIAGNOSIS — T85848A Pain due to other internal prosthetic devices, implants and grafts, initial encounter: Secondary | ICD-10-CM

## 2018-12-01 DIAGNOSIS — Z7984 Long term (current) use of oral hypoglycemic drugs: Secondary | ICD-10-CM | POA: Insufficient documentation

## 2018-12-01 DIAGNOSIS — Z6841 Body Mass Index (BMI) 40.0 and over, adult: Secondary | ICD-10-CM | POA: Insufficient documentation

## 2018-12-01 DIAGNOSIS — M19072 Primary osteoarthritis, left ankle and foot: Secondary | ICD-10-CM | POA: Insufficient documentation

## 2018-12-01 DIAGNOSIS — M12571 Traumatic arthropathy, right ankle and foot: Secondary | ICD-10-CM

## 2018-12-01 DIAGNOSIS — Y831 Surgical operation with implant of artificial internal device as the cause of abnormal reaction of the patient, or of later complication, without mention of misadventure at the time of the procedure: Secondary | ICD-10-CM | POA: Insufficient documentation

## 2018-12-01 HISTORY — DX: Prediabetes: R73.03

## 2018-12-01 HISTORY — DX: Sleep apnea, unspecified: G47.30

## 2018-12-01 HISTORY — PX: ANKLE ARTHROSCOPY: SHX545

## 2018-12-01 HISTORY — PX: HARDWARE REMOVAL: SHX979

## 2018-12-01 LAB — GLUCOSE, CAPILLARY
Glucose-Capillary: 110 mg/dL — ABNORMAL HIGH (ref 70–99)
Glucose-Capillary: 123 mg/dL — ABNORMAL HIGH (ref 70–99)

## 2018-12-01 LAB — POCT PREGNANCY, URINE: Preg Test, Ur: NEGATIVE

## 2018-12-01 SURGERY — ARTHROSCOPY, ANKLE
Anesthesia: General | Site: Ankle | Laterality: Right

## 2018-12-01 MED ORDER — BUPIVACAINE-EPINEPHRINE (PF) 0.5% -1:200000 IJ SOLN
INTRAMUSCULAR | Status: DC | PRN
  Administered 2018-12-01: 20 mL via PERINEURAL

## 2018-12-01 MED ORDER — LIDOCAINE 2% (20 MG/ML) 5 ML SYRINGE
INTRAMUSCULAR | Status: AC
  Filled 2018-12-01: qty 5

## 2018-12-01 MED ORDER — FENTANYL CITRATE (PF) 100 MCG/2ML IJ SOLN
25.0000 ug | INTRAMUSCULAR | Status: DC | PRN
  Administered 2018-12-01: 25 ug via INTRAVENOUS
  Administered 2018-12-01: 50 ug via INTRAVENOUS
  Administered 2018-12-01: 25 ug via INTRAVENOUS
  Administered 2018-12-01: 50 ug via INTRAVENOUS

## 2018-12-01 MED ORDER — DEXAMETHASONE SODIUM PHOSPHATE 10 MG/ML IJ SOLN
INTRAMUSCULAR | Status: DC | PRN
  Administered 2018-12-01: 10 mg via INTRAVENOUS

## 2018-12-01 MED ORDER — PROPOFOL 500 MG/50ML IV EMUL
INTRAVENOUS | Status: AC
  Filled 2018-12-01: qty 50

## 2018-12-01 MED ORDER — LACTATED RINGERS IV SOLN: INTRAVENOUS | Status: DC

## 2018-12-01 MED ORDER — FENTANYL CITRATE (PF) 100 MCG/2ML IJ SOLN
INTRAMUSCULAR | Status: AC
  Filled 2018-12-01: qty 2

## 2018-12-01 MED ORDER — OXYCODONE HCL 5 MG PO TABS
5.0000 mg | ORAL_TABLET | Freq: Once | ORAL | Status: AC
  Administered 2018-12-01: 5 mg via ORAL

## 2018-12-01 MED ORDER — SODIUM CHLORIDE 0.9 % IR SOLN
Status: DC | PRN
  Administered 2018-12-01: 4500 mL

## 2018-12-01 MED ORDER — BUPIVACAINE HCL (PF) 0.25 % IJ SOLN
INTRAMUSCULAR | Status: AC
  Filled 2018-12-01: qty 90

## 2018-12-01 MED ORDER — DEXAMETHASONE SODIUM PHOSPHATE 10 MG/ML IJ SOLN
INTRAMUSCULAR | Status: AC
  Filled 2018-12-01: qty 1

## 2018-12-01 MED ORDER — CEFAZOLIN SODIUM-DEXTROSE 2-4 GM/100ML-% IV SOLN
INTRAVENOUS | Status: AC
  Filled 2018-12-01: qty 100

## 2018-12-01 MED ORDER — ONDANSETRON HCL 4 MG/2ML IJ SOLN
INTRAMUSCULAR | Status: DC | PRN
  Administered 2018-12-01: 4 mg via INTRAVENOUS

## 2018-12-01 MED ORDER — KETOROLAC TROMETHAMINE 30 MG/ML IJ SOLN
INTRAMUSCULAR | Status: DC | PRN
  Administered 2018-12-01: 30 mg via INTRAVENOUS

## 2018-12-01 MED ORDER — BUPIVACAINE-EPINEPHRINE (PF) 0.25% -1:200000 IJ SOLN
INTRAMUSCULAR | Status: AC
  Filled 2018-12-01: qty 30

## 2018-12-01 MED ORDER — ONDANSETRON HCL 4 MG/2ML IJ SOLN
INTRAMUSCULAR | Status: AC
  Filled 2018-12-01: qty 2

## 2018-12-01 MED ORDER — LACTATED RINGERS IV SOLN
INTRAVENOUS | Status: DC
  Administered 2018-12-01: 07:00:00 via INTRAVENOUS

## 2018-12-01 MED ORDER — FENTANYL CITRATE (PF) 100 MCG/2ML IJ SOLN
INTRAMUSCULAR | Status: DC | PRN
  Administered 2018-12-01: 25 ug via INTRAVENOUS
  Administered 2018-12-01: 50 ug via INTRAVENOUS
  Administered 2018-12-01: 25 ug via INTRAVENOUS

## 2018-12-01 MED ORDER — LIDOCAINE 2% (20 MG/ML) 5 ML SYRINGE
INTRAMUSCULAR | Status: DC | PRN
  Administered 2018-12-01: 60 mg via INTRAVENOUS

## 2018-12-01 MED ORDER — MIDAZOLAM HCL 2 MG/2ML IJ SOLN
INTRAMUSCULAR | Status: AC
  Filled 2018-12-01: qty 2

## 2018-12-01 MED ORDER — BUPIVACAINE HCL (PF) 0.5 % IJ SOLN
INTRAMUSCULAR | Status: AC
  Filled 2018-12-01: qty 60

## 2018-12-01 MED ORDER — OXYCODONE HCL 5 MG PO TABS
ORAL_TABLET | ORAL | Status: AC
  Filled 2018-12-01: qty 1

## 2018-12-01 MED ORDER — FENTANYL CITRATE (PF) 100 MCG/2ML IJ SOLN
50.0000 ug | INTRAMUSCULAR | Status: DC | PRN
  Administered 2018-12-01: 100 ug via INTRAVENOUS

## 2018-12-01 MED ORDER — METOCLOPRAMIDE HCL 5 MG/ML IJ SOLN: 10.0000 mg | Freq: Once | INTRAMUSCULAR | Status: DC | PRN

## 2018-12-01 MED ORDER — MEPERIDINE HCL 25 MG/ML IJ SOLN: 6.2500 mg | INTRAMUSCULAR | Status: DC | PRN

## 2018-12-01 MED ORDER — CEFAZOLIN SODIUM-DEXTROSE 2-4 GM/100ML-% IV SOLN
2.0000 g | INTRAVENOUS | Status: AC
  Administered 2018-12-01: 2 g via INTRAVENOUS

## 2018-12-01 MED ORDER — HYDROCODONE-ACETAMINOPHEN 5-325 MG PO TABS: 1.0000 | ORAL_TABLET | ORAL | 0 refills | Status: DC | PRN

## 2018-12-01 MED ORDER — SCOPOLAMINE 1 MG/3DAYS TD PT72: 1.0000 | MEDICATED_PATCH | Freq: Once | TRANSDERMAL | Status: DC | PRN

## 2018-12-01 MED ORDER — MIDAZOLAM HCL 2 MG/2ML IJ SOLN
1.0000 mg | INTRAMUSCULAR | Status: DC | PRN
  Administered 2018-12-01: 2 mg via INTRAVENOUS

## 2018-12-01 MED ORDER — CHLORHEXIDINE GLUCONATE 4 % EX LIQD: 60.0000 mL | Freq: Once | CUTANEOUS | Status: DC

## 2018-12-01 MED ORDER — PROPOFOL 10 MG/ML IV BOLUS
INTRAVENOUS | Status: DC | PRN
  Administered 2018-12-01: 200 mg via INTRAVENOUS

## 2018-12-01 SURGICAL SUPPLY — 40 items
BNDG COHESIVE 4X5 TAN STRL (GAUZE/BANDAGES/DRESSINGS) ×3 IMPLANT
BNDG COHESIVE 6X5 TAN STRL LF (GAUZE/BANDAGES/DRESSINGS) IMPLANT
BNDG ESMARK 4X9 LF (GAUZE/BANDAGES/DRESSINGS) IMPLANT
COVER WAND RF STERILE (DRAPES) IMPLANT
DISSECTOR 4.0MM X 13CM (MISCELLANEOUS) IMPLANT
DRAPE ARTHROSCOPY W/POUCH 90 (DRAPES) ×3 IMPLANT
DRAPE OEC MINIVIEW 54X84 (DRAPES) IMPLANT
DRAPE U-SHAPE 47X51 STRL (DRAPES) ×3 IMPLANT
DRSG EMULSION OIL 3X3 NADH (GAUZE/BANDAGES/DRESSINGS) ×3 IMPLANT
DURAPREP 26ML APPLICATOR (WOUND CARE) ×3 IMPLANT
EXCALIBUR 3.8MM X 13CM (MISCELLANEOUS) ×3 IMPLANT
GAUZE SPONGE 4X4 12PLY STRL (GAUZE/BANDAGES/DRESSINGS) ×3 IMPLANT
GLOVE BIOGEL PI IND STRL 7.0 (GLOVE) ×6 IMPLANT
GLOVE BIOGEL PI IND STRL 8.5 (GLOVE) ×2 IMPLANT
GLOVE BIOGEL PI INDICATOR 7.0 (GLOVE) ×3
GLOVE BIOGEL PI INDICATOR 8.5 (GLOVE) ×1
GLOVE ECLIPSE 6.5 STRL STRAW (GLOVE) ×3 IMPLANT
GLOVE SURG ORTHO 9.0 STRL STRW (GLOVE) ×3 IMPLANT
GLOVE SURG SS PI 6.5 STRL IVOR (GLOVE) ×3 IMPLANT
GOWN STRL REUS W/ TWL LRG LVL3 (GOWN DISPOSABLE) ×4 IMPLANT
GOWN STRL REUS W/ TWL XL LVL3 (GOWN DISPOSABLE) IMPLANT
GOWN STRL REUS W/TWL LRG LVL3 (GOWN DISPOSABLE) ×2
GOWN STRL REUS W/TWL XL LVL3 (GOWN DISPOSABLE)
IV NS IRRIG 3000ML ARTHROMATIC (IV SOLUTION) ×6 IMPLANT
MANIFOLD NEPTUNE II (INSTRUMENTS) IMPLANT
NDL SAFETY ECLIPSE 18X1.5 (NEEDLE) IMPLANT
NEEDLE HYPO 18GX1.5 SHARP (NEEDLE)
PACK ARTHROSCOPY DSU (CUSTOM PROCEDURE TRAY) ×3 IMPLANT
PACK BASIN DAY SURGERY FS (CUSTOM PROCEDURE TRAY) ×3 IMPLANT
PENCIL BUTTON HOLSTER BLD 10FT (ELECTRODE) IMPLANT
PORT APPOLLO RF 90DEGREE MULTI (SURGICAL WAND) ×3 IMPLANT
SLEEVE SCD COMPRESS KNEE MED (MISCELLANEOUS) ×3 IMPLANT
STRAP ANKLE FOOT DISTRACTOR (ORTHOPEDIC SUPPLIES) IMPLANT
SUCTION FRAZIER HANDLE 10FR (MISCELLANEOUS) ×1
SUCTION TUBE FRAZIER 10FR DISP (MISCELLANEOUS) ×2 IMPLANT
SUT ETHILON 4 0 PS 2 18 (SUTURE) ×3 IMPLANT
SYR 5ML LL (SYRINGE) IMPLANT
TOWEL GREEN STERILE FF (TOWEL DISPOSABLE) ×6 IMPLANT
TUBING ARTHROSCOPY IRRIG 16FT (MISCELLANEOUS) ×3 IMPLANT
WATER STERILE IRR 1000ML POUR (IV SOLUTION) IMPLANT

## 2018-12-01 NOTE — Anesthesia Preprocedure Evaluation (Addendum)
Anesthesia Evaluation  Patient identified by MRN, date of birth, ID band Patient awake    Reviewed: Allergy & Precautions, NPO status , Patient's Chart, lab work & pertinent test results  Airway Mallampati: II  TM Distance: >3 FB Neck ROM: Full    Dental no notable dental hx.    Pulmonary sleep apnea , Current Smoker,    Pulmonary exam normal breath sounds clear to auscultation       Cardiovascular negative cardio ROS Normal cardiovascular exam Rhythm:Regular Rate:Normal     Neuro/Psych negative neurological ROS  negative psych ROS   GI/Hepatic negative GI ROS, Neg liver ROS,   Endo/Other  Morbid obesity  Renal/GU negative Renal ROS  negative genitourinary   Musculoskeletal negative musculoskeletal ROS (+)   Abdominal   Peds negative pediatric ROS (+)  Hematology negative hematology ROS (+)   Anesthesia Other Findings   Reproductive/Obstetrics negative OB ROS                             Anesthesia Physical Anesthesia Plan  ASA: III  Anesthesia Plan: General   Post-op Pain Management:  Regional for Post-op pain   Induction: Intravenous  PONV Risk Score and Plan: 2 and Ondansetron, Dexamethasone, Treatment may vary due to age or medical condition and Midazolam  Airway Management Planned: LMA  Additional Equipment:   Intra-op Plan:   Post-operative Plan:   Informed Consent: I have reviewed the patients History and Physical, chart, labs and discussed the procedure including the risks, benefits and alternatives for the proposed anesthesia with the patient or authorized representative who has indicated his/her understanding and acceptance.     Dental advisory given  Plan Discussed with: CRNA  Anesthesia Plan Comments:        Anesthesia Quick Evaluation

## 2018-12-01 NOTE — Anesthesia Postprocedure Evaluation (Signed)
Anesthesia Post Note  Patient: Heidi Ellison  Procedure(s) Performed: RIGHT ANKLE ARTHROSCOPY AND DEBRIDEMENT (Right Ankle) REMOVAL HARDWARE RIGHT ANKLE (Right )     Patient location during evaluation: PACU Anesthesia Type: General Level of consciousness: awake and alert Pain management: pain level controlled Vital Signs Assessment: post-procedure vital signs reviewed and stable Respiratory status: spontaneous breathing, nonlabored ventilation, respiratory function stable and patient connected to nasal cannula oxygen Cardiovascular status: blood pressure returned to baseline and stable Postop Assessment: no apparent nausea or vomiting Anesthetic complications: no    Last Vitals:  Vitals:   12/01/18 0930 12/01/18 1015  BP: 120/70 122/60  Pulse: 84 78  Resp: (!) 22 18  Temp:  36.7 C  SpO2: 99% 98%    Last Pain:  Vitals:   12/01/18 1015  PainSc: 6                  Phillips Grout

## 2018-12-01 NOTE — Anesthesia Procedure Notes (Signed)
Anesthesia Regional Block: Popliteal block   Pre-Anesthetic Checklist: ,, timeout performed, Correct Patient, Correct Site, Correct Laterality, Correct Procedure, Correct Position, site marked, Risks and benefits discussed,  Surgical consent,  Pre-op evaluation,  At surgeon's request and post-op pain management  Laterality: Right and Lower  Prep: Maximum Sterile Barrier Precautions used, chloraprep       Needles:  Injection technique: Single-shot  Needle Type: Echogenic Stimulator Needle     Needle Length: 10cm      Additional Needles:   Procedures:,,,, ultrasound used (permanent image in chart),,,,  Narrative:  Start time: 12/01/2018 7:05 AM End time: 12/01/2018 7:11 AM Injection made incrementally with aspirations every 5 mL.  Performed by: Personally  Anesthesiologist: Phillips Grout, MD  Additional Notes: Risks, benefits and alternative to block explained extensively.  Patient tolerated procedure well, without complications.

## 2018-12-01 NOTE — Transfer of Care (Signed)
Immediate Anesthesia Transfer of Care Note  Patient: Heidi Ellison  Procedure(s) Performed: RIGHT ANKLE ARTHROSCOPY AND DEBRIDEMENT (Right Ankle) REMOVAL HARDWARE RIGHT ANKLE (Right )  Patient Location: PACU  Anesthesia Type:GA combined with regional for post-op pain  Level of Consciousness: awake, alert  and oriented  Airway & Oxygen Therapy: Patient Spontanous Breathing and Patient connected to face mask oxygen  Post-op Assessment: Report given to RN and Post -op Vital signs reviewed and stable  Post vital signs: Reviewed and stable  Last Vitals:  Vitals Value Taken Time  BP 108/56 12/01/2018  8:24 AM  Temp    Pulse 103 12/01/2018  8:25 AM  Resp 24 12/01/2018  8:25 AM  SpO2 100 % 12/01/2018  8:25 AM  Vitals shown include unvalidated device data.  Last Pain:  Vitals:   12/01/18 0723  PainSc: 0-No pain         Complications: No apparent anesthesia complications

## 2018-12-01 NOTE — Progress Notes (Signed)
Assisted Dr. Carignan with right, ultrasound guided, popliteal block. Side rails up, monitors on throughout procedure. See vital signs in flow sheet. Tolerated Procedure well. 

## 2018-12-01 NOTE — Discharge Instructions (Signed)
No ibuprofen until 2:15pm!    Post Anesthesia Home Care Instructions  Activity: Get plenty of rest for the remainder of the day. A responsible individual must stay with you for 24 hours following the procedure.  For the next 24 hours, DO NOT: -Drive a car -Advertising copywriter -Drink alcoholic beverages -Take any medication unless instructed by your physician -Make any legal decisions or sign important papers.  Meals: Start with liquid foods such as gelatin or soup. Progress to regular foods as tolerated. Avoid greasy, spicy, heavy foods. If nausea and/or vomiting occur, drink only clear liquids until the nausea and/or vomiting subsides. Call your physician if vomiting continues.  Special Instructions/Symptoms: Your throat may feel dry or sore from the anesthesia or the breathing tube placed in your throat during surgery. If this causes discomfort, gargle with warm salt water. The discomfort should disappear within 24 hours.  If you had a scopolamine patch placed behind your ear for the management of post- operative nausea and/or vomiting:  1. The medication in the patch is effective for 72 hours, after which it should be removed.  Wrap patch in a tissue and discard in the trash. Wash hands thoroughly with soap and water. 2. You may remove the patch earlier than 72 hours if you experience unpleasant side effects which may include dry mouth, dizziness or visual disturbances. 3. Avoid touching the patch. Wash your hands with soap and water after contact with the patch.      Regional Anesthesia Blocks  1. Numbness or the inability to move the "blocked" extremity may last from 3-48 hours after placement. The length of time depends on the medication injected and your individual response to the medication. If the numbness is not going away after 48 hours, call your surgeon.  2. The extremity that is blocked will need to be protected until the numbness is gone and the  Strength has returned.  Because you cannot feel it, you will need to take extra care to avoid injury. Because it may be weak, you may have difficulty moving it or using it. You may not know what position it is in without looking at it while the block is in effect.  3. For blocks in the legs and feet, returning to weight bearing and walking needs to be done carefully. You will need to wait until the numbness is entirely gone and the strength has returned. You should be able to move your leg and foot normally before you try and bear weight or walk. You will need someone to be with you when you first try to ensure you do not fall and possibly risk injury.  4. Bruising and tenderness at the needle site are common side effects and will resolve in a few days.  5. Persistent numbness or new problems with movement should be communicated to the surgeon or the [facility] [phone] [facility] [phone]).

## 2018-12-01 NOTE — Op Note (Signed)
12/01/2018  8:25 AM  PATIENT:  [name]    PRE-OPERATIVE DIAGNOSIS:  Traumatic Arthritis Right Ankle and painful deep retained hardware broken Hardware  POST-OPERATIVE DIAGNOSIS:  Same  PROCEDURE:  RIGHT ANKLE ARTHROSCOPY AND DEBRIDEMENT, REMOVAL HARDWARE RIGHT ANKLE  SURGEON:  Nadara Mustard, MD  PHYSICIAN ASSISTANT:None ANESTHESIA:   General  PREOPERATIVE INDICATIONS:  Heidi Ellison is a  28 y.o. female with a diagnosis of Traumatic Arthritis Right Ankle and Broken Hardware who failed conservative measures and elected for surgical management.    The risks benefits and alternatives were discussed with the patient preoperatively including but not limited to the risks of infection, bleeding, nerve injury, cardiopulmonary complications, the need for revision surgery, among others, and the patient was willing to proceed.  OPERATIVE IMPLANTS: None.  @ENCIMAGES @  OPERATIVE FINDINGS: Extensive traumatic arthritis including a fracture line that went through the plafond of the tibia consistent with a previous pilon fracture.  Degenerative articular changes of the talar dome with delaminated cartilage.  OPERATIVE PROCEDURE: Patient was brought the operating room underwent a popliteal block and then a general anesthetic.  After adequate levels anesthesia were obtained patient's right lower extremity was prepped using DuraPrep draped into a sterile field a timeout was called.  Incision was first made over the medial malleolus.  Debridement was performed of the tissue bone and callus that had grown over the hardware.  The hardware was identified and was removed with both the screw and the washer.  The wound was irrigated normal saline incision was closed using 4-0 nylon.  Attention was then focused on the ankle.  An anterior medial anterior lateral arthroscopic portal was made incision was made through the skin blunt dissection was carried down to the capsule and a blunt trocar was used  inserted into the capsule.  Visualization showed extensive synovitis.  Using the shaver and the electrical wand the synovitis was debrided to visualize the joint.  Visualization showed delaminated cartilage of the talar dome.  This was debrided with a shaver.  Medial and lateral gutters were also debrided.  Visualization of the tibiotalar joint showed extensive degenerative changes of the tibial plafond.  There was a fracture line that went through the tibial plafond.  The shaver was used to debride the delaminated cartilage off the tibial plafond.  Medial lateral gutters were cleansed all synovitis was also excised.  Electrocautery was used for hemostasis.  Survey of the compartment showed a congruent joint after debridement.  There was a good joint space.  The instruments removed the portals were closed using 4-0 nylon a sterile dressing was applied patient was extubated taken the PACU in stable condition.   DISCHARGE PLANNING:  Antibiotic duration: Preoperative antibiotics  Weightbearing: Weightbearing as tolerated  Pain medication: Prescription for Vicodin  Dressing care/ Wound VAC: Change dressing in 2 days  Ambulatory devices: Crutches  Discharge to: Home  Follow-up: In the office 1 week post operative.
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
## 2018-12-01 NOTE — Anesthesia Procedure Notes (Signed)
Procedure Name: LMA Insertion Date/Time: 12/01/2018 7:46 AM Performed by: Pearson Grippe, CRNA Pre-anesthesia Checklist: Patient identified, Emergency Drugs available, Suction available and Patient being monitored Patient Re-evaluated:Patient Re-evaluated prior to induction Oxygen Delivery Method: Circle system utilized Preoxygenation: Pre-oxygenation with 100% oxygen Induction Type: IV induction Ventilation: Mask ventilation without difficulty LMA: LMA inserted LMA Size: 4.0 Number of attempts: 1 Airway Equipment and Method: Bite block Placement Confirmation: positive ETCO2 Tube secured with: Tape Dental Injury: Teeth and Oropharynx as per pre-operative assessment

## 2018-12-02 ENCOUNTER — Encounter: Payer: Self-pay | Admitting: Orthopedic Surgery

## 2018-12-02 ENCOUNTER — Encounter (HOSPITAL_BASED_OUTPATIENT_CLINIC_OR_DEPARTMENT_OTHER): Payer: Self-pay | Admitting: Orthopedic Surgery

## 2018-12-15 ENCOUNTER — Encounter: Payer: Self-pay | Admitting: Orthopedic Surgery

## 2018-12-15 ENCOUNTER — Other Ambulatory Visit: Payer: Self-pay

## 2018-12-15 ENCOUNTER — Ambulatory Visit (INDEPENDENT_AMBULATORY_CARE_PROVIDER_SITE_OTHER): Payer: Self-pay | Admitting: Orthopedic Surgery

## 2018-12-15 VITALS — Ht 62.0 in | Wt 233.0 lb

## 2018-12-15 DIAGNOSIS — M19171 Post-traumatic osteoarthritis, right ankle and foot: Secondary | ICD-10-CM

## 2018-12-15 DIAGNOSIS — M19172 Post-traumatic osteoarthritis, left ankle and foot: Secondary | ICD-10-CM

## 2018-12-15 DIAGNOSIS — T85848S Pain due to other internal prosthetic devices, implants and grafts, sequela: Secondary | ICD-10-CM
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
## 2018-12-24 ENCOUNTER — Encounter: Payer: Self-pay | Admitting: Orthopedic Surgery

## 2018-12-30 ENCOUNTER — Encounter: Payer: Self-pay | Admitting: Orthopedic Surgery

## 2019-01-04 ENCOUNTER — Other Ambulatory Visit: Payer: Self-pay

## 2019-01-04 ENCOUNTER — Ambulatory Visit (INDEPENDENT_AMBULATORY_CARE_PROVIDER_SITE_OTHER): Payer: BC Managed Care – PPO | Admitting: Orthopedic Surgery

## 2019-01-04 ENCOUNTER — Encounter: Payer: Self-pay | Admitting: Orthopedic Surgery

## 2019-01-04 VITALS — Ht 62.0 in | Wt 233.0 lb

## 2019-01-04 DIAGNOSIS — M19171 Post-traumatic osteoarthritis, right ankle and foot: Secondary | ICD-10-CM

## 2019-01-04 DIAGNOSIS — M19172 Post-traumatic osteoarthritis, left ankle and foot: Secondary | ICD-10-CM
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
## 2019-01-18 ENCOUNTER — Ambulatory Visit: Payer: Medicaid Other | Admitting: Orthopedic Surgery

## 2019-02-01 ENCOUNTER — Encounter: Payer: Self-pay | Admitting: Orthopedic Surgery

## 2019-02-01 ENCOUNTER — Other Ambulatory Visit: Payer: Self-pay

## 2019-02-01 ENCOUNTER — Ambulatory Visit (INDEPENDENT_AMBULATORY_CARE_PROVIDER_SITE_OTHER): Payer: Self-pay | Admitting: Orthopedic Surgery

## 2019-02-01 VITALS — Ht 62.0 in | Wt 233.0 lb

## 2019-02-01 DIAGNOSIS — M19172 Post-traumatic osteoarthritis, left ankle and foot: Secondary | ICD-10-CM

## 2019-02-01 DIAGNOSIS — M19171 Post-traumatic osteoarthritis, right ankle and foot: Secondary | ICD-10-CM

## 2019-02-02 ENCOUNTER — Encounter: Payer: Self-pay | Admitting: Orthopedic Surgery
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
## 2019-03-01 ENCOUNTER — Ambulatory Visit: Payer: Medicaid Other | Admitting: Orthopedic Surgery

## 2019-03-15 ENCOUNTER — Ambulatory Visit: Payer: Medicaid Other | Admitting: Orthopedic Surgery

## 2019-03-22 ENCOUNTER — Ambulatory Visit: Payer: Medicaid Other | Admitting: Orthopedic Surgery

## 2019-03-22 ENCOUNTER — Encounter: Payer: Self-pay | Admitting: Orthopedic Surgery

## 2019-03-22 ENCOUNTER — Ambulatory Visit (INDEPENDENT_AMBULATORY_CARE_PROVIDER_SITE_OTHER): Payer: Self-pay | Admitting: Orthopedic Surgery

## 2019-03-22 VITALS — Ht 62.0 in | Wt 233.0 lb

## 2019-03-22 DIAGNOSIS — M7661 Achilles tendinitis, right leg: Secondary | ICD-10-CM
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
## 2019-03-30 ENCOUNTER — Encounter: Payer: Self-pay | Admitting: Physical Therapy

## 2019-03-30 ENCOUNTER — Other Ambulatory Visit: Payer: Self-pay

## 2019-03-30 ENCOUNTER — Ambulatory Visit: Payer: Self-pay | Attending: Orthopedic Surgery | Admitting: Physical Therapy

## 2019-03-30 DIAGNOSIS — M25671 Stiffness of right ankle, not elsewhere classified: Secondary | ICD-10-CM | POA: Insufficient documentation

## 2019-03-30 DIAGNOSIS — M25571 Pain in right ankle and joints of right foot: Secondary | ICD-10-CM | POA: Insufficient documentation

## 2019-03-30 DIAGNOSIS — M6281 Muscle weakness (generalized): Secondary | ICD-10-CM | POA: Insufficient documentation

## 2019-03-30 DIAGNOSIS — R2689 Other abnormalities of gait and mobility: Secondary | ICD-10-CM | POA: Insufficient documentation

## 2019-03-30 NOTE — Patient Instructions (Signed)
Access Code: [email or web address]  URL: [email or web address]  Date: 03/30/2019  Prepared by: Jeral Pinch   Exercises  Seated Ankle Dorsiflexion AROM - 10 reps - 2 sets - 5 hold - 1x daily  Seated Ankle Eversion with Resistance - 10 reps - 3 sets - 1x daily  Standing Single Leg Stance with Unilateral Counter Support - 1 sets - 60 hold - 1x daily  Gastroc Stretch on Wall - 45 hold - 1x daily  Standing Soleus Stretch - 1 reps - 60 hold - 1x daily
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
## 2019-04-08 ENCOUNTER — Ambulatory Visit: Payer: Self-pay | Attending: Orthopedic Surgery | Admitting: Physical Therapy

## 2019-04-08 ENCOUNTER — Encounter: Payer: Self-pay | Admitting: Physical Therapy

## 2019-04-08 ENCOUNTER — Other Ambulatory Visit: Payer: Self-pay

## 2019-04-08 DIAGNOSIS — M25671 Stiffness of right ankle, not elsewhere classified: Secondary | ICD-10-CM | POA: Insufficient documentation

## 2019-04-08 DIAGNOSIS — M6281 Muscle weakness (generalized): Secondary | ICD-10-CM | POA: Insufficient documentation

## 2019-04-08 DIAGNOSIS — M25571 Pain in right ankle and joints of right foot: Secondary | ICD-10-CM | POA: Insufficient documentation

## 2019-04-08 DIAGNOSIS — R2689 Other abnormalities of gait and mobility: Secondary | ICD-10-CM | POA: Insufficient documentation

## 2019-04-08 NOTE — Patient Instructions (Signed)
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
## 2019-04-12 ENCOUNTER — Ambulatory Visit: Payer: Self-pay | Admitting: Physical Therapy

## 2019-04-12 ENCOUNTER — Other Ambulatory Visit: Payer: Self-pay

## 2019-04-12 ENCOUNTER — Encounter: Payer: Self-pay | Admitting: Physical Therapy

## 2019-04-12 DIAGNOSIS — R2689 Other abnormalities of gait and mobility: Secondary | ICD-10-CM

## 2019-04-12 DIAGNOSIS — M6281 Muscle weakness (generalized): Secondary | ICD-10-CM

## 2019-04-12 DIAGNOSIS — M25671 Stiffness of right ankle, not elsewhere classified: Secondary | ICD-10-CM

## 2019-04-12 DIAGNOSIS — M25571 Pain in right ankle and joints of right foot: Secondary | ICD-10-CM
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
## 2019-04-19 ENCOUNTER — Telehealth: Payer: Self-pay | Admitting: Physical Therapy

## 2019-04-19 ENCOUNTER — Other Ambulatory Visit: Payer: Self-pay

## 2019-04-19 ENCOUNTER — Ambulatory Visit (INDEPENDENT_AMBULATORY_CARE_PROVIDER_SITE_OTHER): Payer: Self-pay | Admitting: Orthopedic Surgery

## 2019-04-19 ENCOUNTER — Ambulatory Visit (INDEPENDENT_AMBULATORY_CARE_PROVIDER_SITE_OTHER): Payer: Self-pay

## 2019-04-19 ENCOUNTER — Ambulatory Visit: Payer: Self-pay | Admitting: Physical Therapy

## 2019-04-19 ENCOUNTER — Encounter: Payer: Self-pay | Admitting: Orthopedic Surgery

## 2019-04-19 VITALS — Ht 62.0 in | Wt 233.0 lb

## 2019-04-19 DIAGNOSIS — M19172 Post-traumatic osteoarthritis, left ankle and foot: Secondary | ICD-10-CM

## 2019-04-19 DIAGNOSIS — M19171 Post-traumatic osteoarthritis, right ankle and foot: Secondary | ICD-10-CM

## 2019-04-19 NOTE — Telephone Encounter (Signed)
Called patient to check on status regarding no show for 3:00 appointment. She thought appointment was at 3:30. Confirmed next scheduled time for next week.

## 2019-04-26 ENCOUNTER — Telehealth: Payer: Self-pay | Admitting: Physical Therapy

## 2019-04-26 ENCOUNTER — Ambulatory Visit: Payer: Self-pay | Admitting: Physical Therapy

## 2019-04-26 NOTE — Telephone Encounter (Signed)
Called and left voicemail regarding missed therapy appointment today.

## 2019-05-11 ENCOUNTER — Encounter: Payer: Self-pay | Admitting: Orthopedic Surgery
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
## 2019-06-08 ENCOUNTER — Ambulatory Visit (INDEPENDENT_AMBULATORY_CARE_PROVIDER_SITE_OTHER): Payer: Self-pay | Admitting: Physician Assistant

## 2019-06-08 ENCOUNTER — Encounter: Payer: Self-pay | Admitting: Physician Assistant

## 2019-06-08 ENCOUNTER — Other Ambulatory Visit: Payer: Self-pay

## 2019-06-08 DIAGNOSIS — R0981 Nasal congestion: Secondary | ICD-10-CM

## 2019-06-08 DIAGNOSIS — Z7189 Other specified counseling: Secondary | ICD-10-CM

## 2019-06-08 NOTE — Progress Notes (Signed)
Virtual Visit via Video   I connected with Heidi Ellison on 06/08/19 at  3:40 PM EST by a video enabled telemedicine application and verified that I am speaking with the correct person using two identifiers. Location patient: Home Location provider: [facility], Office Persons participating in the virtual visit: Heidi Ellison, [place] PA-C, Heidi Pickler, LPN   I discussed the limitations of evaluation and management by telemedicine and the availability of in person appointments. The patient expressed understanding and agreed to proceed.  I acted as a Education administrator for [employer], PA-C [employer], LPN  Subjective:   HPI:   Patient is requesting evaluation for possible COVID-19.  Symptom onset: yesterday  Travel/contacts: sister with congestion -- earlier this week; sister tested for COVID last week and was negative  Patient endorses the following symptoms: sinus headache, sinus congestion, rhinorrhea, itchy watery eyes, red eyes  Patient denies the following symptoms: Fever (none), sore throat and difficulty swallowing; cough; loss of taste/smell; diarrhea; myalgia; cp; sob  Treatments tried: Pt has been using Visine in her eyes, did an eye wash and taking Mortin with some relief.  Patient risk factors: Current ZOXWR-60 risk of complications score: 1 Smoking status: Heidi Ellison  reports that she has been smoking cigarettes. She has never used smokeless tobacco. If female, currently pregnant? []   Yes [x]   No  ROS: See pertinent positives and negatives per HPI.  Patient Active Problem List   Diagnosis Date Noted   Traumatic arthritis of right ankle    Pain from implanted hardware 09/24/2018   Post-traumatic osteoarthritis of both ankles 08/31/2018   Endometriosis 10/24/2017   Tobacco abuse 10/22/2017   Arthritis 08/01/2017   Chronic migraine 04/22/2017   Migraine without status migrainosus, not intractable 01/11/2016    Social  History   Tobacco Use   Smoking status: Current Some Day Smoker    Types: Cigarettes   Smokeless tobacco: Never Used   Tobacco comment: 1-2 cigarettes per day  Substance Use Topics   Alcohol use: Yes    Comment: Occasionally    Current Outpatient Medications:    aspirin-acetaminophen-caffeine (EXCEDRIN MIGRAINE) 250-250-65 MG tablet, Take 2 tablets by mouth every 6 (six) hours as needed for headache., Disp: , Rfl:    hydrOXYzine (ATARAX/VISTARIL) 25 MG tablet, Take 1 tablet (25 mg total) by mouth at bedtime as needed for anxiety (insomnia). Take one 25 mg tablet 30-60 minutes prior to bedtime for insomnia, anxiety. May increase to two tablets., Disp: 60 tablet, Rfl: 0   ibuprofen (ADVIL,MOTRIN) 200 MG tablet, Take 400 mg by mouth every 6 (six) hours as needed for fever or headache (pain). , Disp: , Rfl:    HYDROcodone-acetaminophen (NORCO/VICODIN) 5-325 MG tablet, Take 1-2 tablets by mouth every 4 (four) hours as needed for moderate pain. (Patient not taking: Reported on 06/08/2019), Disp: 40 tablet, Rfl: 0   metFORMIN (GLUCOPHAGE) 500 MG tablet, Take 1 tablet (500 mg total) by mouth daily with breakfast. (Patient not taking: Reported on 06/08/2019), Disp: 30 tablet, Rfl: 2  No Known Allergies  Objective:   VITALS: Per patient if applicable, see vitals. GENERAL: Alert, appears well and in no acute distress. HEENT: Atraumatic, conjunctiva clear, no obvious abnormalities on inspection of external nose and ears. NECK: Normal movements of the head and neck. CARDIOPULMONARY: No increased WOB. Speaking in clear sentences. I:E ratio WNL.  MS: Moves all visible extremities without noticeable abnormality. PSYCH: Pleasant and cooperative, well-groomed. Speech normal rate and rhythm. Affect is  appropriate. Insight and judgement are appropriate. Attention is focused, linear, and appropriate.  NEURO: CN grossly intact. Oriented as arrived to appointment on time with no prompting. Moves both  UE equally.  SKIN: No obvious lesions, wounds, erythema, or cyanosis noted on face or hands.  Assessment and Plan:   Amoree was seen today for covid symptoms.  Diagnoses and all orders for this visit:  Sinus congestion  Advice given about COVID-19 virus infection   Patient has a respiratory illness without signs of acute distress or respiratory compromise at this time. This is likely a viral infection, which can come from a number of respiratory viruses. We are going to send patient for drive-up testing. As a precaution, they have been advised to remain home until COVID-19 results and then possible further quarantine after that based on results and symptoms. Advised if they experience a "second sickening" or worsening symptoms as the illness progresses, they are to call the office for further instructions or seek emergent evaluation for any severe symptoms.    Reviewed expectations re: course of current medical issues.  Discussed self-management of symptoms.  Outlined signs and symptoms indicating need for more acute intervention.  Patient verbalized understanding and all questions were answered.  Health Maintenance issues including appropriate healthy diet, exercise, and smoking avoidance were discussed with patient.  See orders for this visit as documented in the electronic medical record.  I discussed the assessment and treatment plan with the patient. The patient was provided an opportunity to ask questions and all were answered. The patient agreed with the plan and demonstrated an understanding of the instructions.   The patient was advised to call back or seek an in-person evaluation if the symptoms worsen or if the condition fails to improve as anticipated.   CMA or LPN served as scribe during this visit. History, Physical, and Plan performed by medical provider. The above documentation has been reviewed and is accurate and complete.   [place], [place] 06/08/2019

## 2019-06-08 NOTE — Telephone Encounter (Signed)
Maddy, can you help this pt with cost?

## 2019-06-08 NOTE — Telephone Encounter (Signed)
LVM FOR PATIENT TO CALL BACK AND SCHEDULE AT LET HER KNOW ABOUT THE DISCOUNT AND ESTIMATE AROUND HOW MUCH IT WOULD BE.

## 2019-06-09 ENCOUNTER — Other Ambulatory Visit: Payer: Self-pay

## 2019-06-09 DIAGNOSIS — Z20822 Contact with and (suspected) exposure to covid-19: Secondary | ICD-10-CM

## 2019-06-11 LAB — NOVEL CORONAVIRUS, NAA: SARS-CoV-2, NAA: NOT DETECTED

## 2019-06-23 ENCOUNTER — Encounter: Payer: Self-pay | Admitting: Orthopedic Surgery

## 2019-06-23 ENCOUNTER — Ambulatory Visit (INDEPENDENT_AMBULATORY_CARE_PROVIDER_SITE_OTHER): Payer: Self-pay | Admitting: Physician Assistant

## 2019-06-23 ENCOUNTER — Other Ambulatory Visit: Payer: Self-pay

## 2019-06-23 ENCOUNTER — Encounter: Payer: Self-pay | Admitting: Physician Assistant

## 2019-06-23 VITALS — Ht 62.0 in | Wt 233.0 lb

## 2019-06-23 DIAGNOSIS — M25571 Pain in right ankle and joints of right foot: Secondary | ICD-10-CM

## 2019-06-23 MED ORDER — PREDNISONE 10 MG PO TABS: 10.0000 mg | ORAL_TABLET | Freq: Every day | ORAL | 0 refills | Status: DC
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
## 2019-07-03 IMAGING — CT CT CERVICAL SPINE W/O CM
3 of 4 series · 13 of 33 positions shown, 16 images · non-contrast
Comparison: None.

CLINICAL DATA: Status post fall 1 day ago. Progressive worsening
neck pain.

EXAM:
CT CERVICAL SPINE WITHOUT CONTRAST
TECHNIQUE: Multidetector CT imaging of the cervical spine was performed without
intravenous contrast. Multiplanar CT image reconstructions were also
generated.

[Series 5: c_spine 2.0 st · axial · 0.24mm/px · z∈[-273,-161]mm · 5 of 86 slices shown, 7 images]
[im 15/86  soft-tissue]
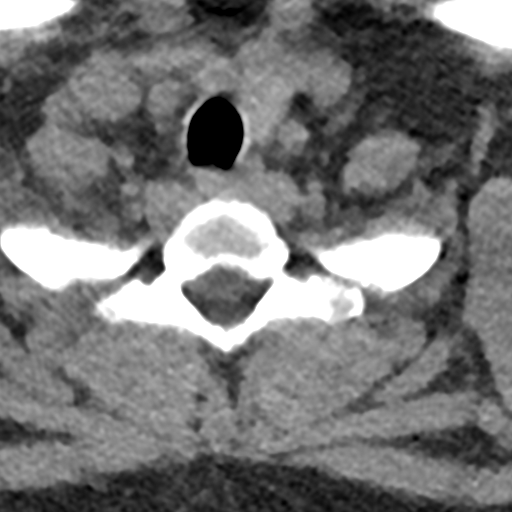
[im 15/86  bone]
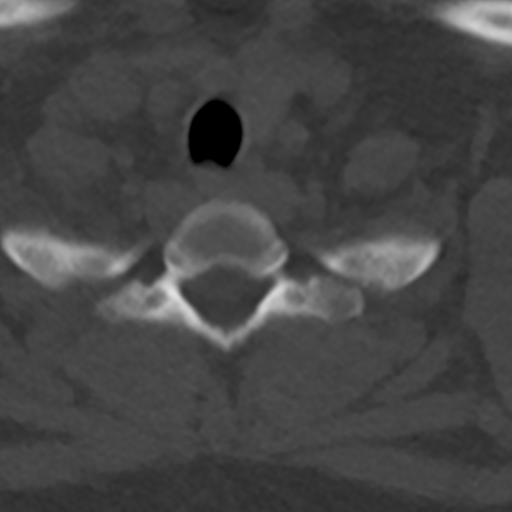
[im 29/86  bone]
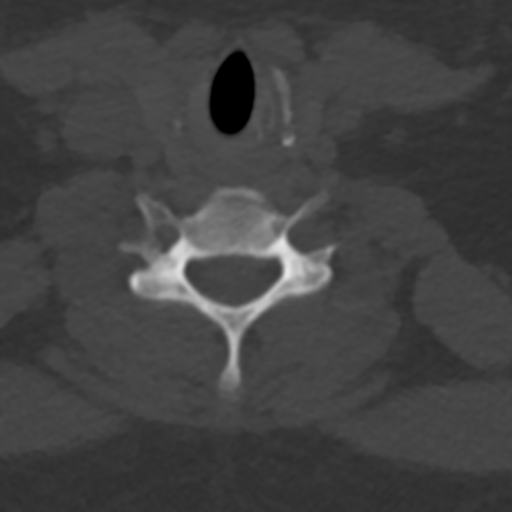
[im 43/86  bone]
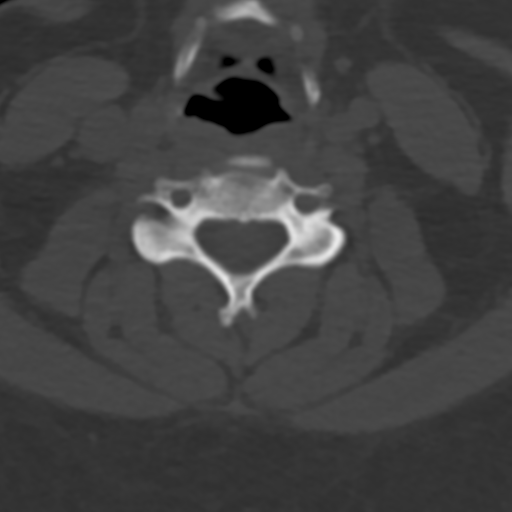
[im 57/86  bone]
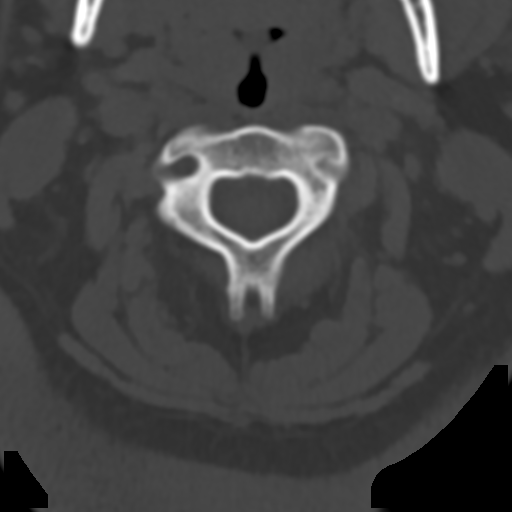
[im 71/86  soft-tissue]
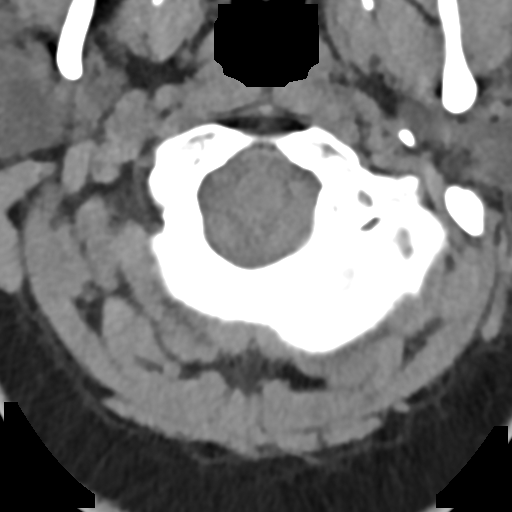
[im 71/86  bone]
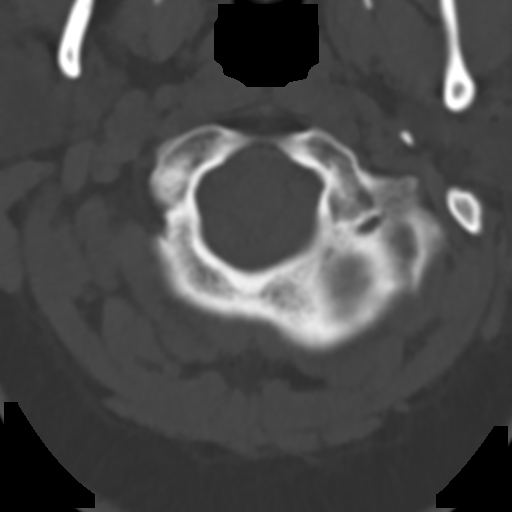

[Series 6: coronal bone · coronal · 0.25mm/px · 3 of 61 slices shown]
[im 13/61  bone]
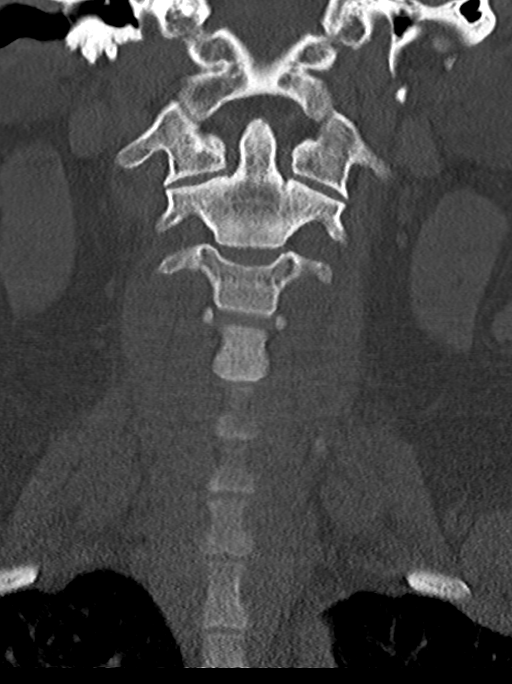
[im 25/61  bone]
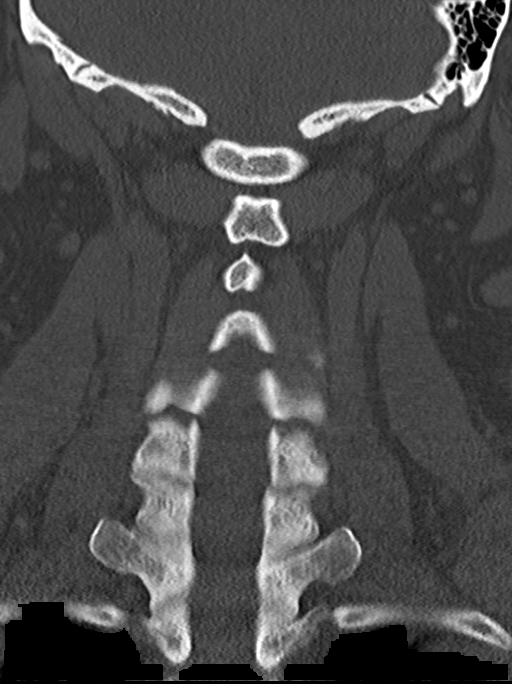
[im 37/61  bone]
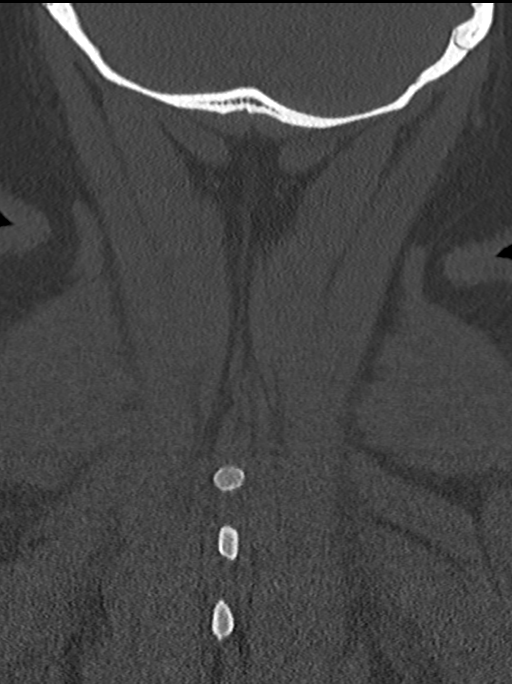

[Series 7: sagittal bone · sagittal · 0.25mm/px · 5 of 61 slices shown, 6 images]
[im 21/61  bone]
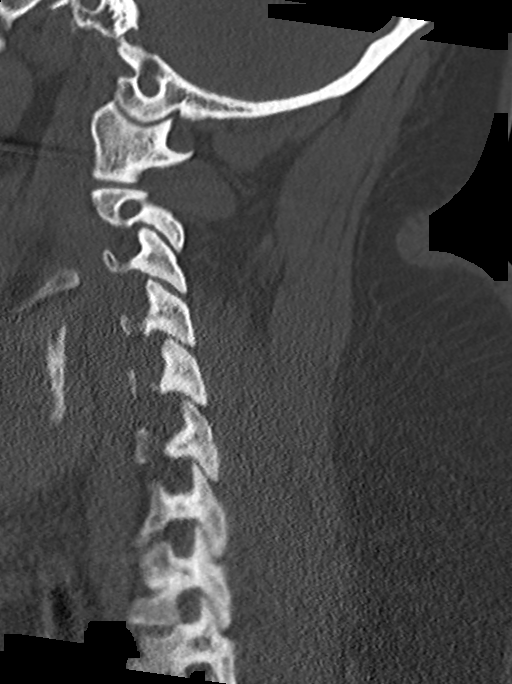
[im 26/61  bone]
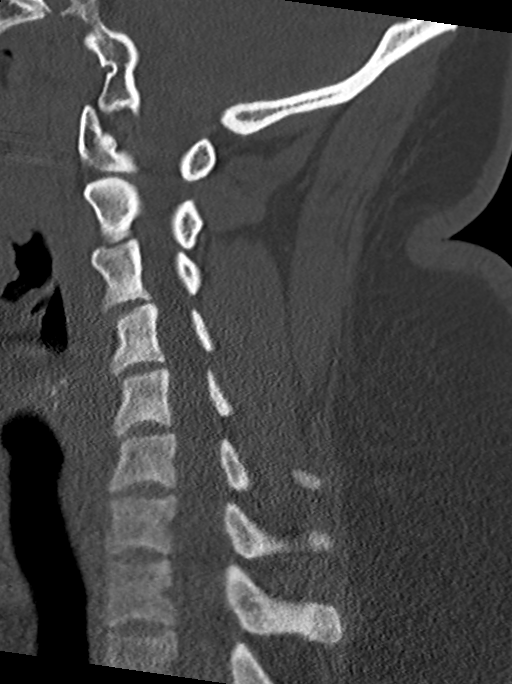
[im 31/61  soft-tissue]
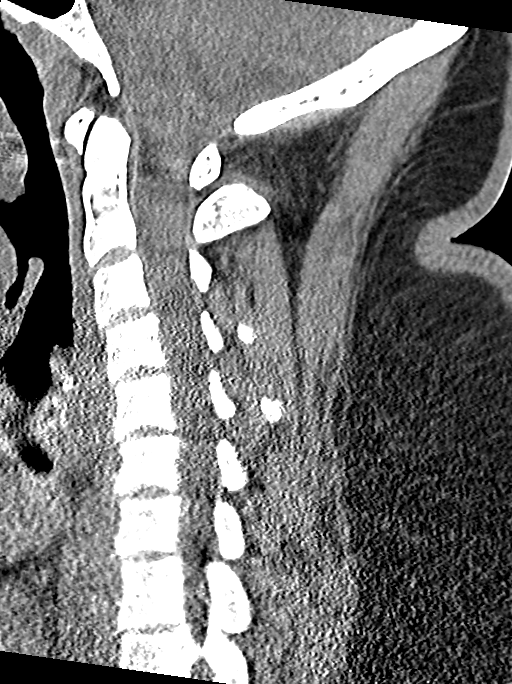
[im 31/61  bone]
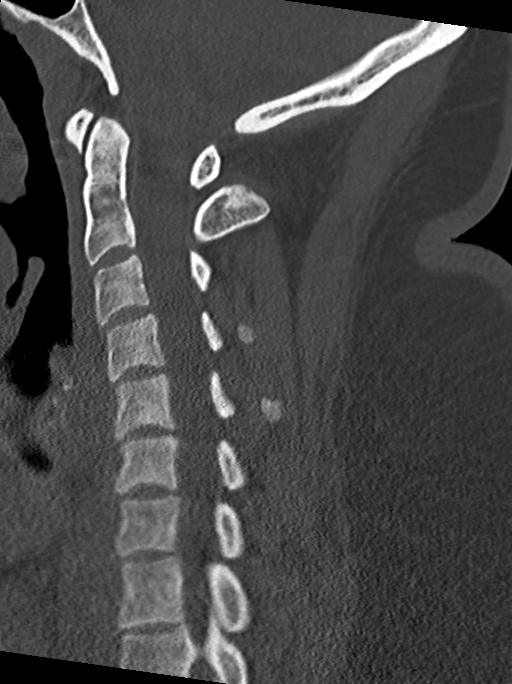
[im 36/61  bone]
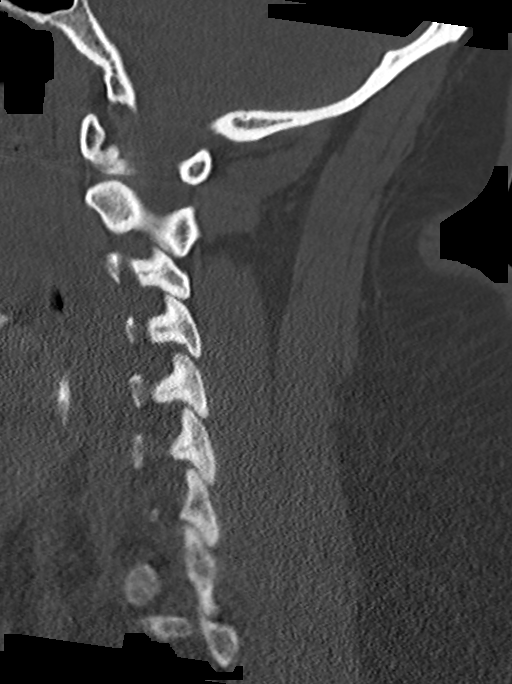
[im 41/61  bone]
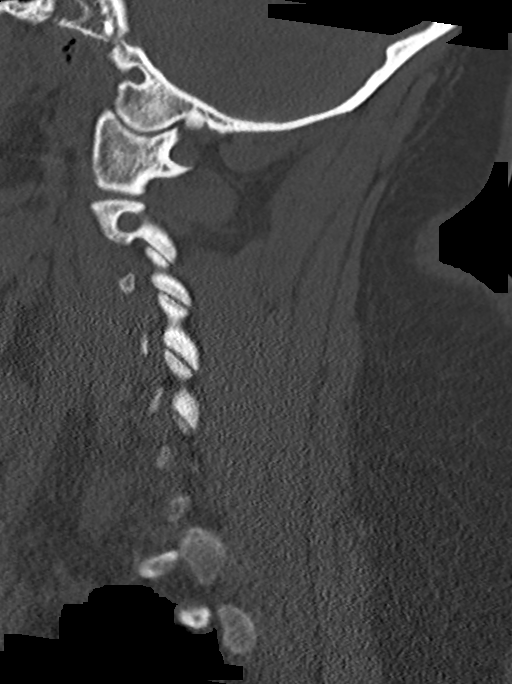

[13 of 33 positions shown; findings below may reference images not displayed]

FINDINGS: Alignment: Normal.

Skull base and vertebrae: No acute fracture. No primary bone lesion
or focal pathologic process.

Soft tissues and spinal canal: No prevertebral fluid or swelling. No
visible canal hematoma.

Disc levels: Disc spaces are maintained. Bilateral foraminal are
patent.

Upper chest: Lung apices are clear.

Other: No fluid collection or hematoma.
IMPRESSION: 1. No acute osseous injury of the cervical spine.

## 2019-07-03 IMAGING — DX DG THORACIC SPINE 2V
4 series · 4 of 4 positions shown · non-contrast
Comparison: Chest radiograph September 14, 2015

CLINICAL DATA: Pain following fall

EXAM:
THORACIC SPINE 3 VIEWS

[t thoracic spine ap (1 of 2)]
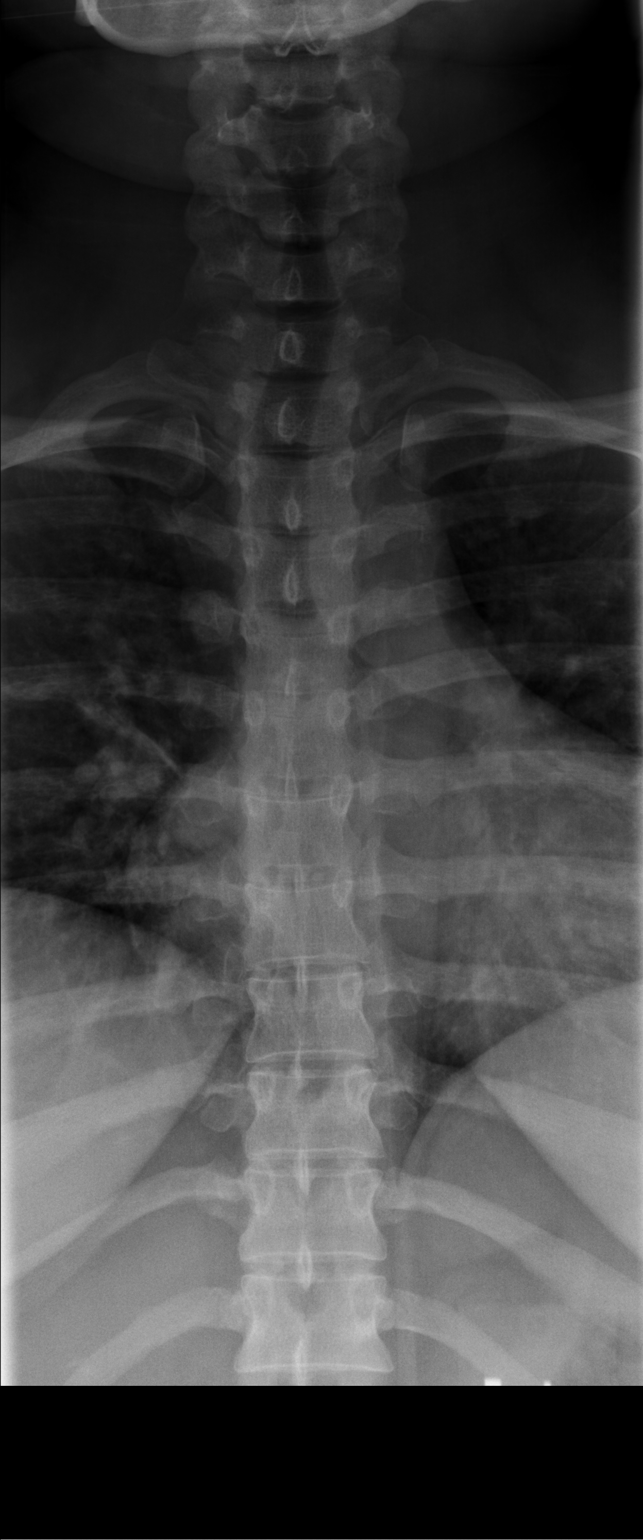

[t thoracic spine ap (2 of 2)]
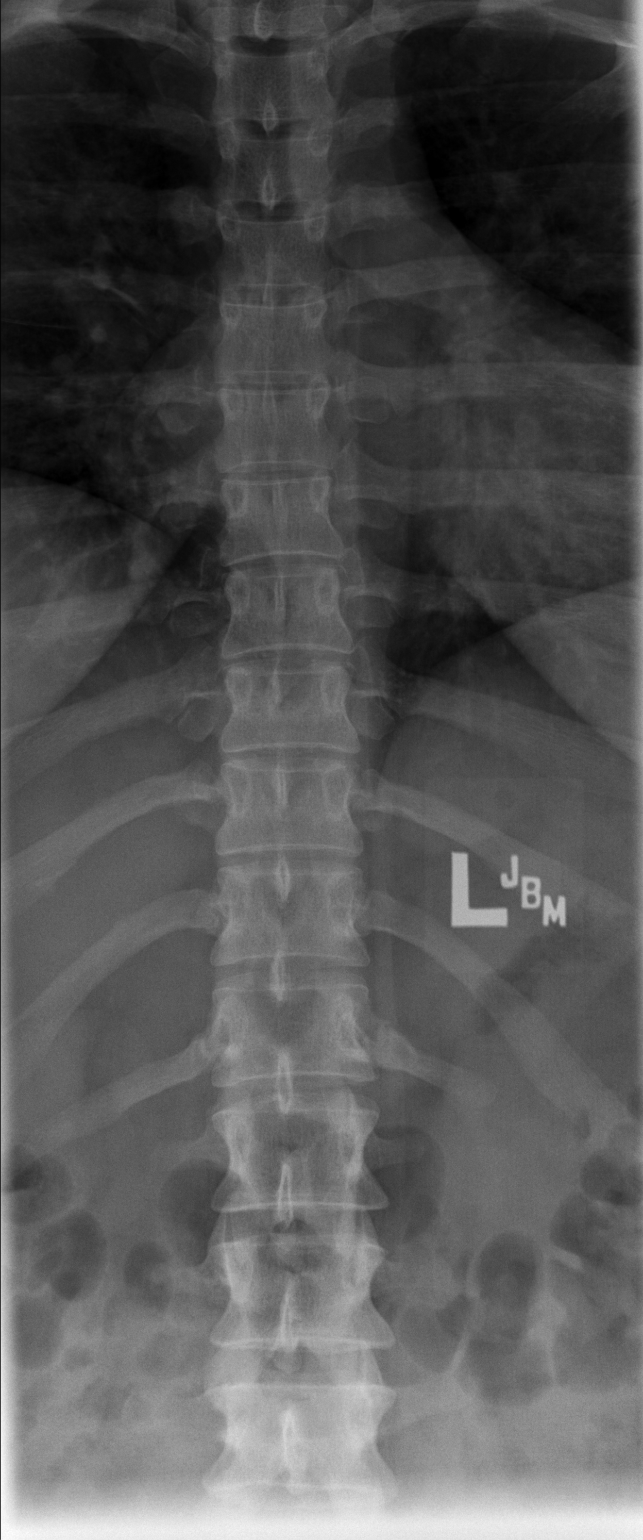

[t thoracic breathing lat]
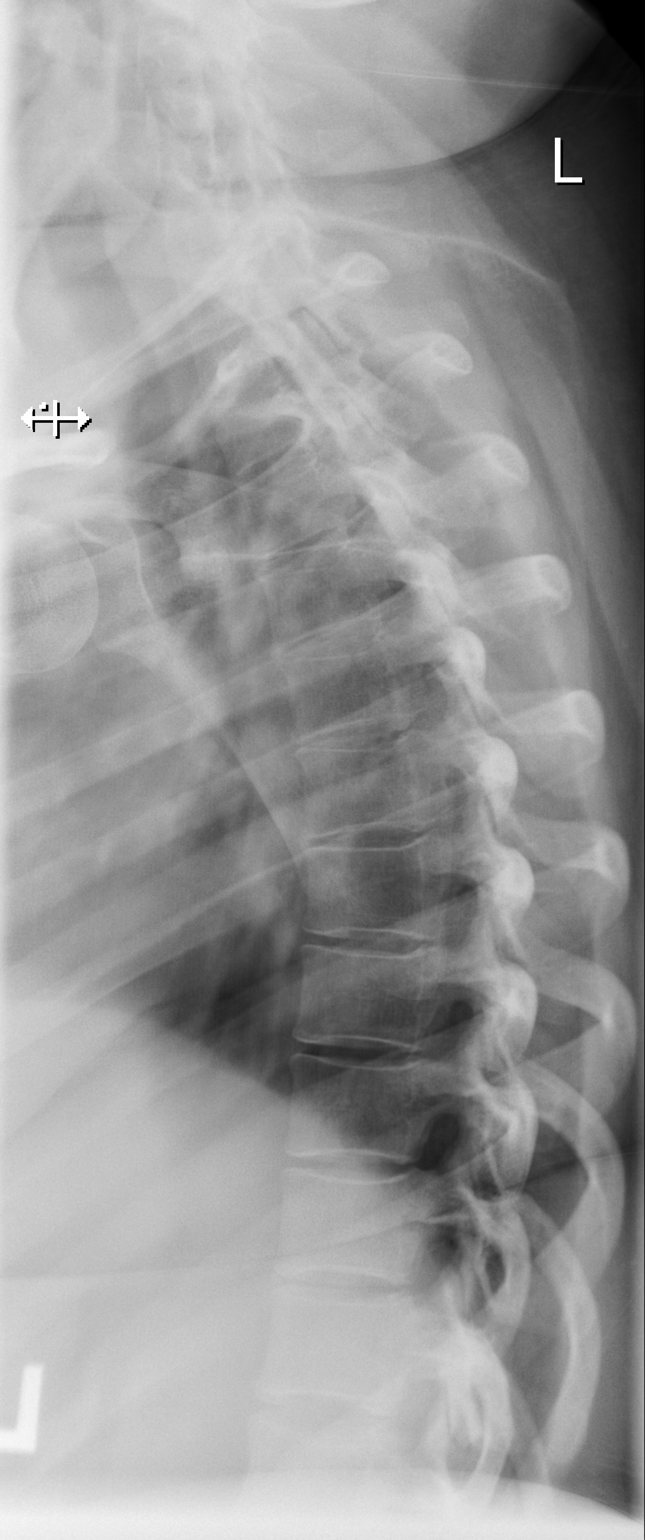

[t thoracic swimmers]
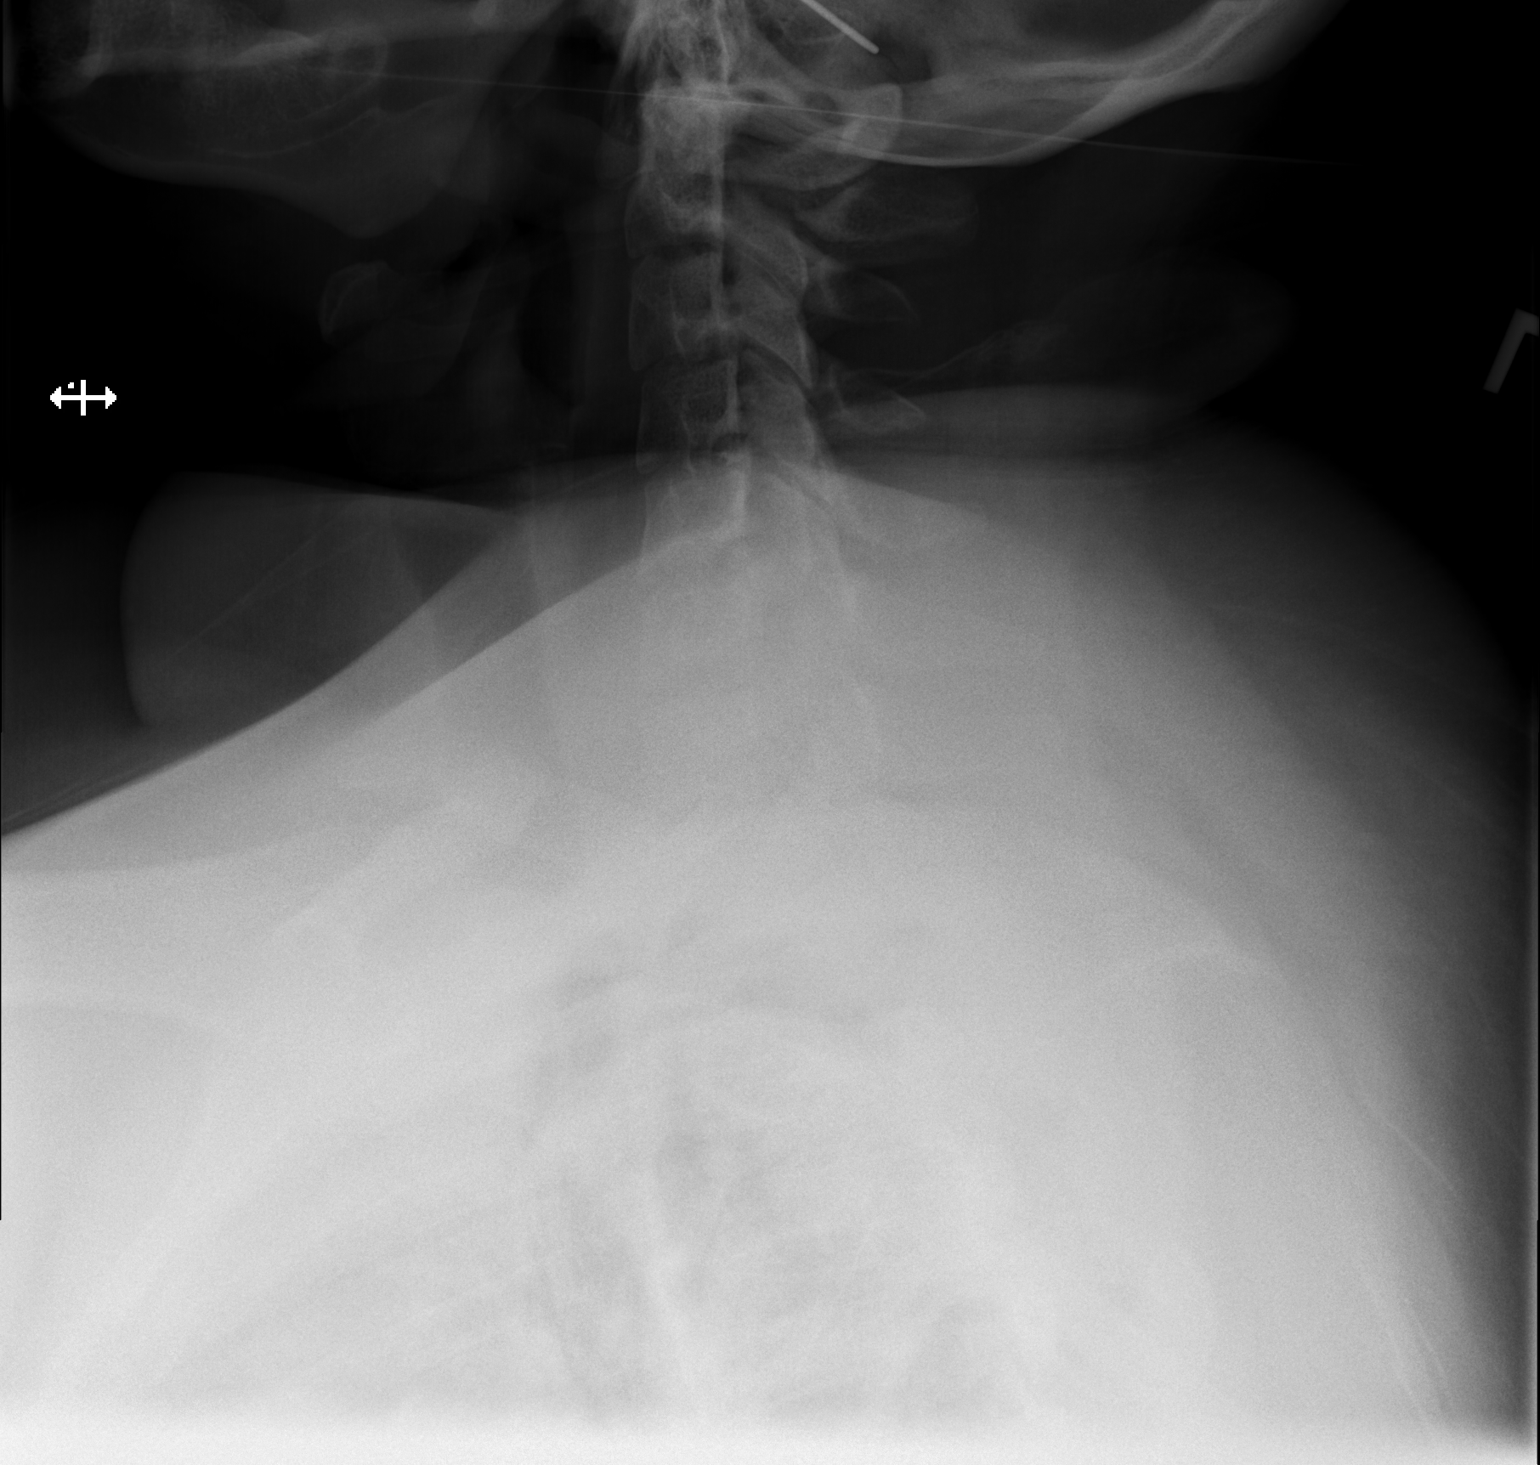

[4 of 4 positions shown; findings below may reference images not displayed]

FINDINGS: Frontal, lateral, and swimmer's views were obtained. There is no
fracture or spondylolisthesis. Disc spaces appear normal. No erosive
change or paraspinous lesion.
IMPRESSION: No fracture or dislocation.  No evident arthropathy.

## 2019-07-07 ENCOUNTER — Other Ambulatory Visit: Payer: Self-pay

## 2019-07-07 ENCOUNTER — Ambulatory Visit: Payer: Self-pay | Admitting: Physician Assistant

## 2019-07-07 ENCOUNTER — Encounter: Payer: Self-pay | Admitting: Physician Assistant

## 2019-07-07 DIAGNOSIS — M25571 Pain in right ankle and joints of right foot: Secondary | ICD-10-CM

## 2019-07-07 MED ORDER — METHYLPREDNISOLONE ACETATE 40 MG/ML IJ SUSP
40.0000 mg | INTRAMUSCULAR | Status: AC | PRN
  Administered 2019-07-07: 16:00:00 40 mg via INTRA_ARTICULAR

## 2019-07-07 MED ORDER — LIDOCAINE HCL 1 % IJ SOLN
2.0000 mL | INTRAMUSCULAR | Status: AC | PRN
  Administered 2019-07-07: 16:00:00 2 mL
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
## 2019-07-18 IMAGING — CT CT ABD-PELV W/O CM
1 of 2 series · 13 of 32 positions shown, 18 images · non-contrast
Comparison: None.

CLINICAL DATA: Palpable area within the abdomen, possible umbilical
hernia

EXAM:
CT ABDOMEN AND PELVIS WITHOUT CONTRAST
TECHNIQUE: Multidetector CT imaging of the abdomen and pelvis was performed
following the standard protocol without IV contrast.

[Series 2: abd/pelvis w/(date) · axial · 0.97mm/px · z∈[-480,-35]mm · 13 of 99 slices shown, 18 images]
[im 5/99  soft-tissue]
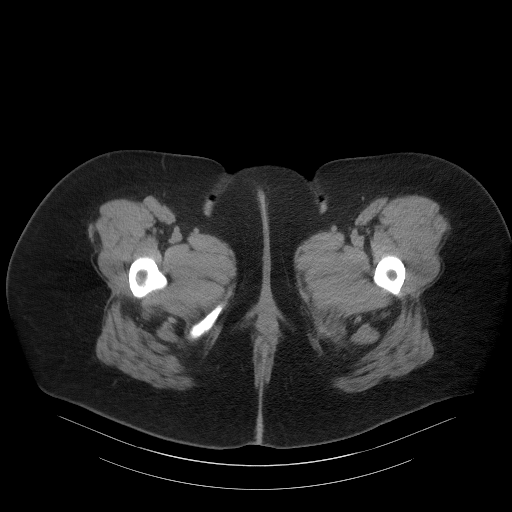
[im 5/99  bone]
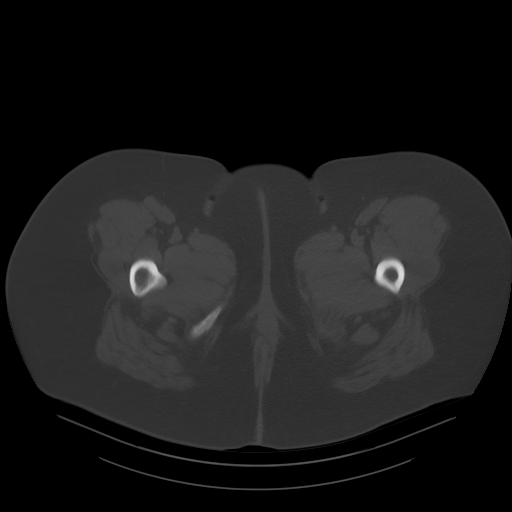
[im 15/99  soft-tissue]
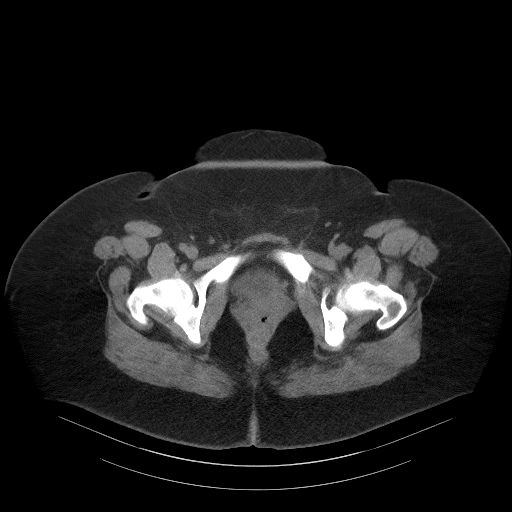
[im 20/99  soft-tissue]
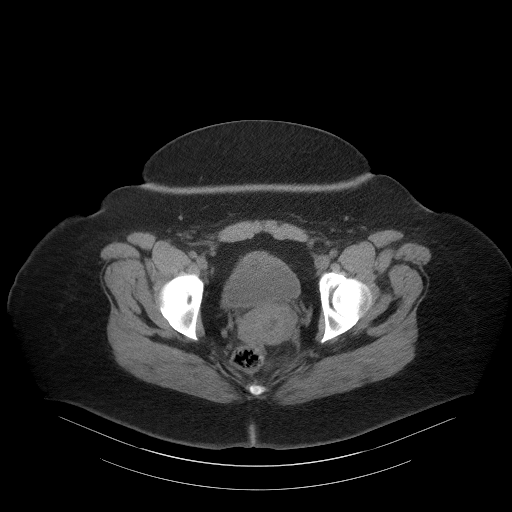
[im 30/99  soft-tissue]
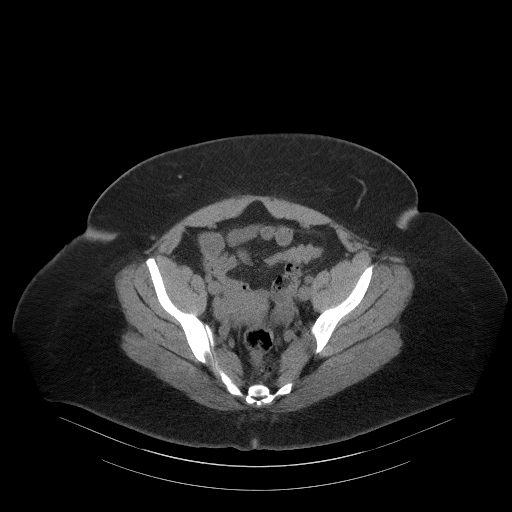
[im 40/99  soft-tissue]
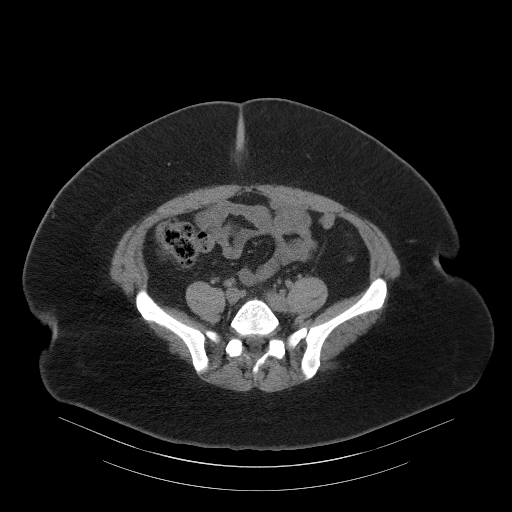
[im 45/99  soft-tissue]
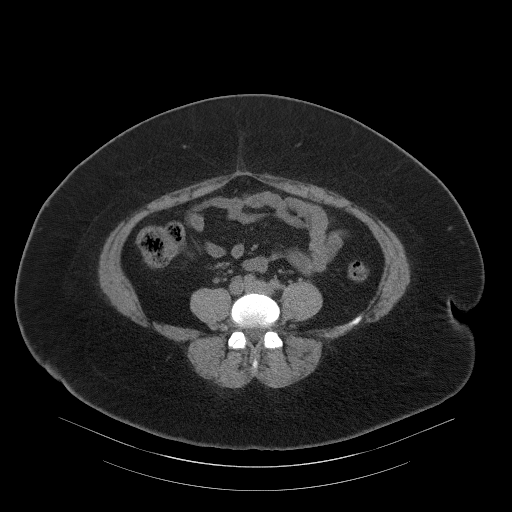
[im 54/99  soft-tissue]
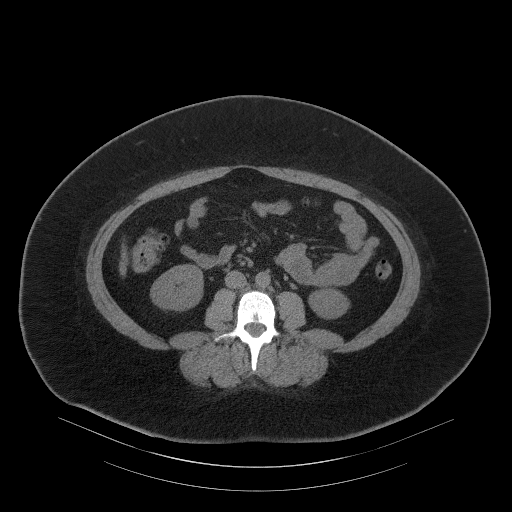
[im 59/99  soft-tissue]
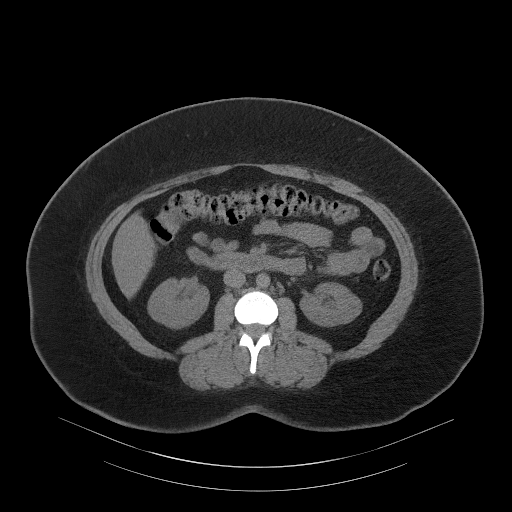
[im 69/99  soft-tissue]
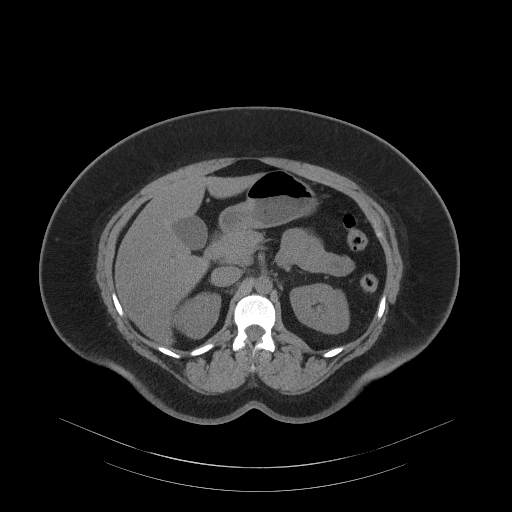
[im 69/99  bone]
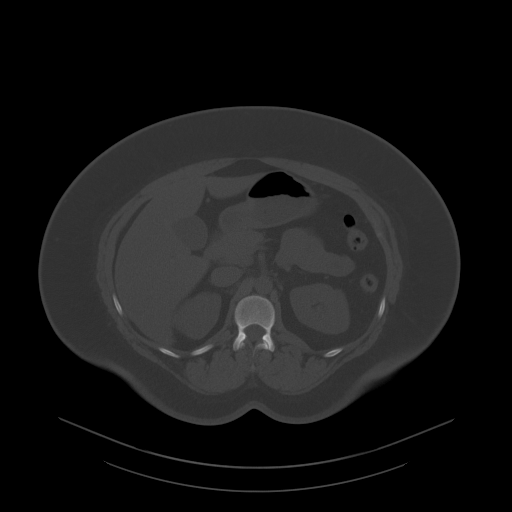
[im 79/99  soft-tissue]
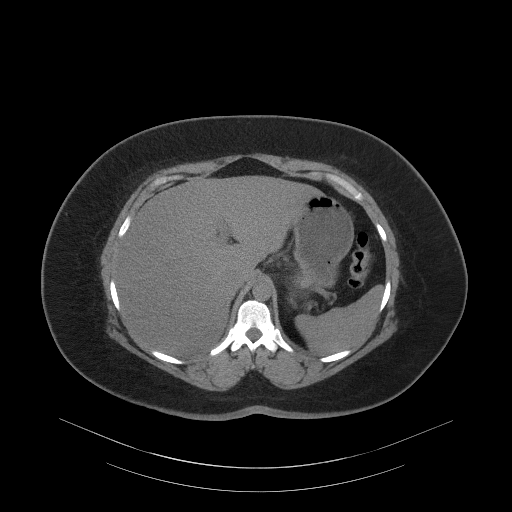
[im 79/99  lung]
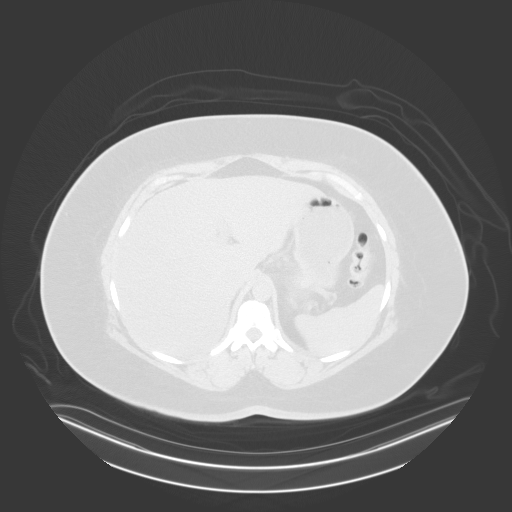
[im 84/99  soft-tissue]
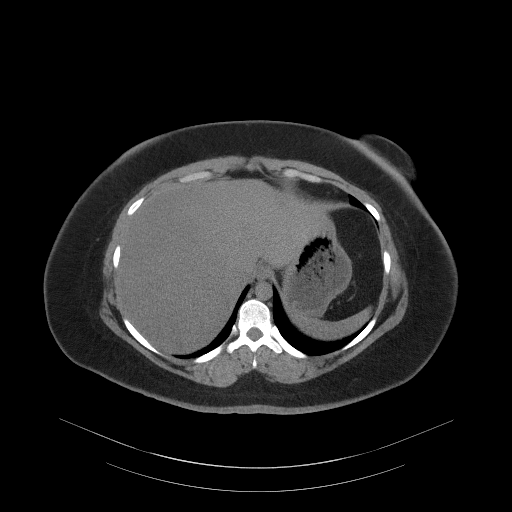
[im 84/99  lung]
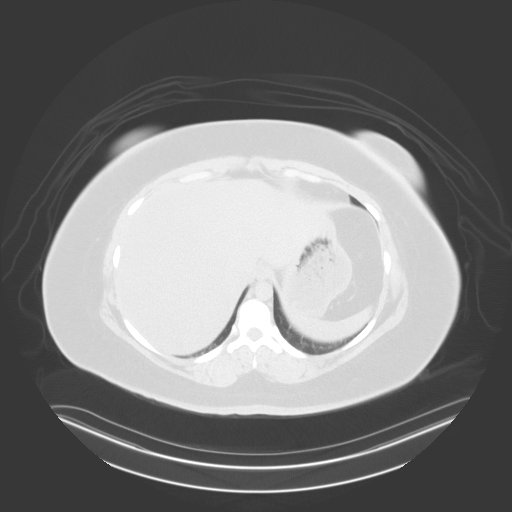
[im 89/99  lung]
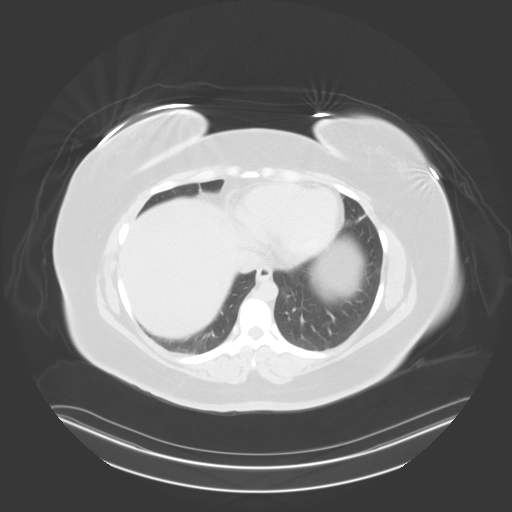
[im 94/99  soft-tissue]
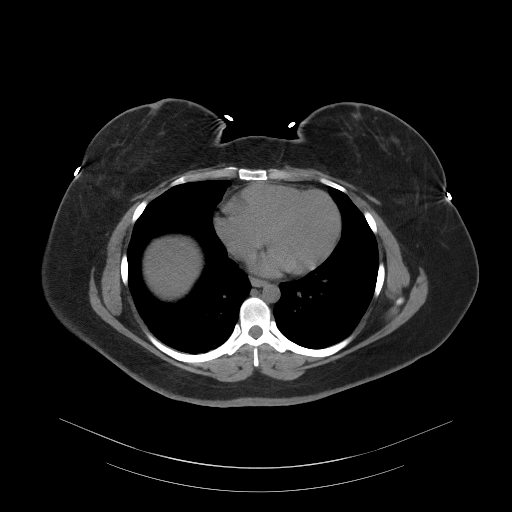
[im 94/99  lung]
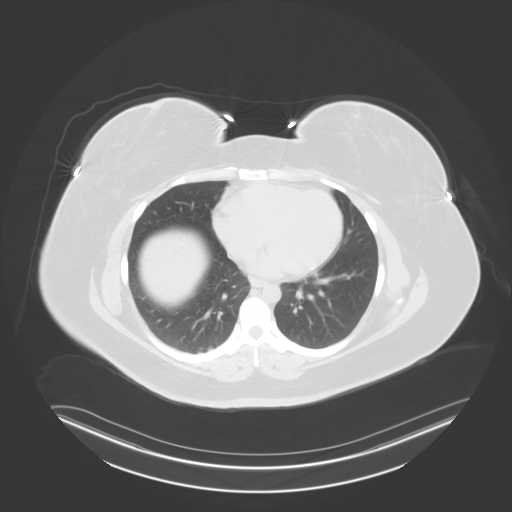

[13 of 32 positions shown; findings below may reference images not displayed]

FINDINGS: Lower chest: The lung bases are clear. Heart is within normal limits
in size.

Hepatobiliary: The liver appears very low in attenuation with areas
of higher attenuation most consistent with fatty infiltration with
areas of sparing. No focal hepatic abnormality is seen. No calcified
gallstones are noted. No ductal dilatation is seen.

Pancreas: The pancreas is normal in size and the pancreatic duct is
not dilated.

Spleen: The spleen is unremarkable.

Adrenals/Urinary Tract: The adrenal glands appear normal. No renal
calculi are seen and there is no evidence of hydronephrosis. No
renal mass is evident on this unenhanced study. The ureters are
normal in caliber. The urinary bladder is not well distended but no
abnormality is seen.

Stomach/Bowel: The stomach is mildly distended with food debris and
fluid. No gastric abnormality is evident. The small bowel is not
dilated. The colon is largely decompressed. The terminal ileum and
the appendix are unremarkable. No inflammatory process is seen.

Vascular/Lymphatic: The abdominal aorta is normal in caliber. No
adenopathy is seen.

Reproductive: There is low-attenuation centrally within the uterus
probably related to the patient's menstrual cycle. The region of the
cervix is somewhat prominent, difficult to assess on this unenhanced
study. Pelvic ultrasound may be helpful if further assessment is
warranted. No adnexal lesion is seen with only small follicles
present. No fluid is noted within the pelvis.

Other: No abdominal wall hernia is seen. The vitamin-E capsule lies
just above the umbilicus in the midline and no underlying
abnormality is seen.

Musculoskeletal: The lumbar vertebrae are in normal alignment with
normal intervertebral disc spaces. The SI joints are corticated.
IMPRESSION: 1. At the site in question just above the umbilicus, no abdominal
wall hernia is seen. No underlying abnormality is noted.
2. Fatty infiltration of the liver with areas of sparing. No ductal
dilatation.
3. Low-attenuation centrally within the uterus extending toward the
cervix possibly related to the patient's menstrual cycle. Pelvic
ultrasound may be helpful if further assessment is warranted.
4. The appendix and terminal ileum are unremarkable.

## 2019-07-29 ENCOUNTER — Ambulatory Visit (INDEPENDENT_AMBULATORY_CARE_PROVIDER_SITE_OTHER): Payer: Self-pay | Admitting: Orthopedic Surgery

## 2019-07-29 ENCOUNTER — Encounter: Payer: Self-pay | Admitting: Orthopedic Surgery

## 2019-07-29 ENCOUNTER — Other Ambulatory Visit: Payer: Self-pay

## 2019-07-29 VITALS — Ht 62.0 in | Wt 233.0 lb

## 2019-07-29 DIAGNOSIS — M19171 Post-traumatic osteoarthritis, right ankle and foot: Secondary | ICD-10-CM

## 2019-07-29 DIAGNOSIS — T85848S Pain due to other internal prosthetic devices, implants and grafts, sequela: Secondary | ICD-10-CM

## 2019-08-03 ENCOUNTER — Encounter: Payer: Self-pay | Admitting: Orthopedic Surgery

## 2019-08-03 DIAGNOSIS — M19172 Post-traumatic osteoarthritis, left ankle and foot: Secondary | ICD-10-CM

## 2019-08-03 DIAGNOSIS — M19171 Post-traumatic osteoarthritis, right ankle and foot: Secondary | ICD-10-CM

## 2019-08-03 DIAGNOSIS — T85848S Pain due to other internal prosthetic devices, implants and grafts, sequela: Secondary | ICD-10-CM

## 2019-08-03 MED ORDER — LIDOCAINE HCL 1 % IJ SOLN
2.0000 mL | INTRAMUSCULAR | Status: AC | PRN
  Administered 2019-08-03: 2 mL

## 2019-08-03 MED ORDER — METHYLPREDNISOLONE ACETATE 40 MG/ML IJ SUSP
40.0000 mg | INTRAMUSCULAR | Status: AC | PRN
  Administered 2019-08-03: 40 mg via INTRA_ARTICULAR
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
## 2019-08-26 ENCOUNTER — Ambulatory Visit: Payer: Managed Care, Other (non HMO) | Admitting: Orthopedic Surgery

## 2019-08-27 ENCOUNTER — Encounter: Payer: BLUE CROSS/BLUE SHIELD | Admitting: Physician Assistant

## 2019-09-02 ENCOUNTER — Encounter: Payer: Self-pay | Admitting: Physician Assistant

## 2019-09-03 ENCOUNTER — Encounter: Payer: Self-pay | Admitting: Physician Assistant

## 2019-09-28 ENCOUNTER — Encounter: Payer: Self-pay | Admitting: Physician Assistant

## 2019-10-06 ENCOUNTER — Telehealth: Payer: Self-pay | Admitting: Physician Assistant

## 2019-10-06 NOTE — Telephone Encounter (Signed)
Landon from [employer] is calling saying they received a letter from the patient wanting a service animal and wanted to verify some information with someone.

## 2019-10-07 ENCOUNTER — Other Ambulatory Visit: Payer: Self-pay

## 2019-10-07 NOTE — Telephone Encounter (Signed)
Left a voicemail for [employer] to return my call.

## 2019-10-07 NOTE — Telephone Encounter (Signed)
Do you have [employer] callback information?

## 2019-10-07 NOTE — Telephone Encounter (Signed)
[phone] --Number to [employer]

## 2019-10-11 ENCOUNTER — Encounter: Payer: Medicaid Other | Admitting: Physician Assistant

## 2019-10-11 NOTE — Telephone Encounter (Signed)
Called [employer] and left a detailed message for someone to return my call.

## 2019-10-14 NOTE — Telephone Encounter (Signed)
Left voice mail for someone from [employer] to return my call [phone]. Unable to reach.

## 2020-02-14 ENCOUNTER — Telehealth (INDEPENDENT_AMBULATORY_CARE_PROVIDER_SITE_OTHER): Payer: Commercial Managed Care - PPO | Admitting: Family

## 2020-02-14 DIAGNOSIS — J069 Acute upper respiratory infection, unspecified: Secondary | ICD-10-CM | POA: Diagnosis not present
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
## 2020-02-15 ENCOUNTER — Encounter: Payer: Self-pay | Admitting: Physician Assistant

## 2020-02-15 ENCOUNTER — Telehealth: Payer: Self-pay | Admitting: Physician Assistant

## 2020-02-15 NOTE — Telephone Encounter (Signed)
Pt tested positive for COVID  

## 2020-02-15 NOTE — Telephone Encounter (Signed)
Spoke to pt told her [name] would like to have a virtual visit with you tomorrow to discuss possible monoclonal antibody infusions, an outpatient treatment that she would likely qualify for. Pt verbalized understanding. Appt scheduled for tomorrow at 12:00 Virtual.

## 2020-02-15 NOTE — Telephone Encounter (Signed)
Please offer virtual visit with patient to discuss possible monoclonal antibody infusions (an outpatient treatment that she would likely qualify for.)

## 2020-02-16 ENCOUNTER — Telehealth: Payer: Self-pay | Admitting: Nurse Practitioner

## 2020-02-16 ENCOUNTER — Telehealth (INDEPENDENT_AMBULATORY_CARE_PROVIDER_SITE_OTHER): Payer: Commercial Managed Care - PPO | Admitting: Physician Assistant

## 2020-02-16 ENCOUNTER — Telehealth (HOSPITAL_COMMUNITY): Payer: Self-pay | Admitting: Nurse Practitioner

## 2020-02-16 ENCOUNTER — Encounter: Payer: Self-pay | Admitting: Physician Assistant

## 2020-02-16 VITALS — Temp 99.8°F | Ht 62.0 in | Wt 240.0 lb

## 2020-02-16 DIAGNOSIS — U071 COVID-19: Secondary | ICD-10-CM

## 2020-02-16 NOTE — Progress Notes (Signed)
Virtual Visit via Video   I connected with Heidi Ellison on 02/16/20 at 12:00 PM EDT by a video enabled telemedicine application and verified that I am speaking with the correct person using two identifiers. Location patient: Home Location provider: [facility], Office Persons participating in the virtual visit: Amry, Cathy PA-C  I discussed the limitations of evaluation and management by telemedicine and the availability of in person appointments. The patient expressed understanding and agreed to proceed.  Subjective:   HPI:   Tested positive for COVID-19 at [facility] urgent [facility] on 8/16.  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  Patient endorses the following symptoms: Fever (intermittent), sinus congestion, sinus pain, sore throat and dry cough (mostly), nausea, slight SOB  Max temperature: 101.6  Patient denies the following symptoms: Chest pain, vomiting  Treatments tried: Vitamin C, Vitamin D, zinc, tylenol  Did not get COVID vaccinated.  Patient risk factors: Current COVID-19 risk of complications score: 1 Smoking status: Heidi Ellison  reports that she has been smoking cigarettes. She has never used smokeless tobacco. If female, currently pregnant? []   Yes [x]   No  ROS: See pertinent positives and negatives per HPI.  Patient Active Problem List   Diagnosis Date Noted  . Traumatic arthritis of right ankle   . Pain from implanted hardware 09/24/2018  . Post-traumatic osteoarthritis of both ankles 08/31/2018  . Endometriosis 10/24/2017  . Tobacco abuse 10/22/2017  . Arthritis 08/01/2017  . Chronic migraine 04/22/2017  . Migraine without status migrainosus, not intractable 01/11/2016    Social History   Tobacco Use  . Smoking status: Current Some Day Smoker    Types: Cigarettes  . Smokeless tobacco: Never Used  . Tobacco comment: 1-2 cigarettes per day  Substance Use Topics  . Alcohol use: Yes     Comment: Occasionally    Current Outpatient Medications:  .  hydrOXYzine (ATARAX/VISTARIL) 25 MG tablet, Take 1 tablet (25 mg total) by mouth at bedtime as needed for anxiety (insomnia). Take one 25 mg tablet 30-60 minutes prior to bedtime for insomnia, anxiety. May increase to two tablets., Disp: 60 tablet, Rfl: 0 .  ibuprofen (ADVIL,MOTRIN) 200 MG tablet, Take 400 mg by mouth every 6 (six) hours as needed for fever or headache (pain). , Disp: , Rfl:   No Known Allergies  Objective:   VITALS: Per patient if applicable, see vitals. GENERAL: Alert, appears well and in no acute distress. HEENT: Atraumatic, conjunctiva clear, no obvious abnormalities on inspection of external nose and ears. NECK: Normal movements of the head and neck. CARDIOPULMONARY: No increased WOB. Speaking in clear sentences. I:E ratio WNL.  MS: Moves all visible extremities without noticeable abnormality. PSYCH: Pleasant and cooperative, well-groomed. Speech normal rate and rhythm. Affect is appropriate. Insight and judgement are appropriate. Attention is focused, linear, and appropriate.  NEURO: CN grossly intact. Oriented as arrived to appointment on time with no prompting. Moves both UE equally.  SKIN: No obvious lesions, wounds, erythema, or cyanosis noted on face or hands.  Assessment and Plan:   [name] was seen today for follow-up.  Diagnoses and all orders for this visit:  COVID-19    No red flags on discussion, patient is not in any obvious distress during our visit. Discussed progression of most viral illness, and recommended supportive care at this point in time.  *She is likely a candidate for infusions, and I have discussed with her, she is agreeable to being screened  and doing infusion if she meets criteria.  Discussed over the counter supportive care options, with recommendations to push fluids and rest. Reviewed return precautions including new/worsening fever, SOB, new/worsening cough or other  concerns.  Recommended need to self-quarantine and practice social distancing until symptoms resolve. I recommend that patient follow-up if symptoms worsen or persist despite treatment x 7-10 days, sooner if needed.   . Reviewed expectations re: course of current medical issues. . Discussed self-management of symptoms. . Outlined signs and symptoms indicating need for more acute intervention. . Patient verbalized understanding and all questions were answered. Marland Kitchen Health Maintenance issues including appropriate healthy diet, exercise, and smoking avoidance were discussed with patient. . See orders for this visit as documented in the electronic medical record.  I discussed the assessment and treatment plan with the patient. The patient was provided an opportunity to ask questions and all were answered. The patient agreed with the plan and demonstrated an understanding of the instructions.   The patient was advised to call back or seek an in-person evaluation if the symptoms worsen or if the condition fails to improve as anticipated.   CMA or LPN served as scribe during this visit. History, Physical, and Plan performed by medical provider. The above documentation has been reviewed and is accurate and complete.  [place], [place] 02/16/2020

## 2020-02-16 NOTE — Telephone Encounter (Signed)
Called to Discuss with patient about Covid symptoms and the use of regeneron, a monoclonal antibody infusion for those with mild to moderate Covid symptoms and at a high risk of hospitalization.     Pt is qualified for this infusion at the [facility] infusion center due to co-morbid conditions and/or a member of an at-risk group.     Unable to reach pt. Left message to return call on sister's vm. Patient's phone number is inactive. Sent [employer].   Consuello Masse, DNP, AGNP-C [phone] (Infusion Center Hotline)

## 2020-02-16 NOTE — Telephone Encounter (Signed)
Called to discuss with Heidi Ellison about Covid symptoms and the use of casirivimab/imdevimab, a combination monoclonal antibody infusion for those with mild to moderate Covid symptoms and at a high risk of hospitalization.     Pt is qualified for this infusion at the [facility] infusion center due to co-morbid conditions BMI 43.   Unable to reach. Voicemail left and MyChart message sent.    Patient Active Problem List   Diagnosis Date Noted  . Traumatic arthritis of right ankle   . Pain from implanted hardware 09/24/2018  . Post-traumatic osteoarthritis of both ankles 08/31/2018  . Endometriosis 10/24/2017  . Tobacco abuse 10/22/2017  . Arthritis 08/01/2017  . Chronic migraine 04/22/2017  . Migraine without status migrainosus, not intractable 01/11/2016    Willette Alma, AGPCNP-BC

## 2020-02-29 ENCOUNTER — Encounter: Payer: Medicaid Other | Admitting: Physician Assistant

## 2020-03-10 ENCOUNTER — Encounter: Payer: Self-pay | Admitting: Physician Assistant

## 2020-03-10 ENCOUNTER — Other Ambulatory Visit: Payer: Self-pay

## 2020-03-10 ENCOUNTER — Ambulatory Visit (INDEPENDENT_AMBULATORY_CARE_PROVIDER_SITE_OTHER): Payer: Commercial Managed Care - PPO | Admitting: Physician Assistant

## 2020-03-10 VITALS — BP 110/70 | HR 92 | Temp 97.0°F | Ht 62.0 in | Wt 247.5 lb

## 2020-03-10 DIAGNOSIS — E8881 Metabolic syndrome: Secondary | ICD-10-CM

## 2020-03-10 DIAGNOSIS — Z0001 Encounter for general adult medical examination with abnormal findings: Secondary | ICD-10-CM

## 2020-03-10 DIAGNOSIS — E669 Obesity, unspecified: Secondary | ICD-10-CM

## 2020-03-10 DIAGNOSIS — R002 Palpitations: Secondary | ICD-10-CM

## 2020-03-10 DIAGNOSIS — G4733 Obstructive sleep apnea (adult) (pediatric): Secondary | ICD-10-CM

## 2020-03-10 LAB — POCT GLYCOSYLATED HEMOGLOBIN (HGB A1C): Hemoglobin A1C: 7.5 % — AB (ref 4.0–5.6)

## 2020-03-10 MED ORDER — TRULICITY 0.75 MG/0.5ML ~~LOC~~ SOAJ: 0.7500 mg | SUBCUTANEOUS | 3 refills | Status: DC

## 2020-03-10 MED ORDER — BUSPIRONE HCL 5 MG PO TABS: 5.0000 mg | ORAL_TABLET | Freq: Three times a day (TID) | ORAL | 1 refills | Status: DC | PRN

## 2020-03-10 NOTE — Patient Instructions (Addendum)
It was great to see you!  Start buspar 5 mg three times daily for anxiety symptoms.  EKG looks normal.  Your HgbA1c is much higher than it should be and is in the diabetic range -- please start 0.75 mg trulicity weekly. Follow-up with me in the office in ONE MONTH to review your diet, medication tolerance.  Please go to the lab for blood work.   Our office will call you with your results unless you have chosen to receive results via MyChart.  If your blood work is normal we will follow-up each year for physicals and as scheduled for chronic medical problems.  If anything is abnormal we will treat accordingly and get you in for a follow-up.  Take care,  [facility] Maintenance, Female Adopting a healthy lifestyle and getting preventive care are important in promoting health and wellness. Ask your health care provider about:  The right schedule for you to have regular tests and exams.  Things you can do on your own to prevent diseases and keep yourself healthy. What should I know about diet, weight, and exercise? Eat a healthy diet   Eat a diet that includes plenty of vegetables, fruits, low-fat dairy products, and lean protein.  Do not eat a lot of foods that are high in solid fats, added sugars, or sodium. Maintain a healthy weight Body mass index (BMI) is used to identify weight problems. It estimates body fat based on height and weight. Your health care provider can help determine your BMI and help you achieve or maintain a healthy weight. Get regular exercise Get regular exercise. This is one of the most important things you can do for your health. Most adults should:  Exercise for at least 150 minutes each week. The exercise should increase your heart rate and make you sweat (moderate-intensity exercise).  Do strengthening exercises at least twice a week. This is in addition to the moderate-intensity exercise.  Spend less time sitting. Even light physical activity  can be beneficial. Watch cholesterol and blood lipids Have your blood tested for lipids and cholesterol at 30 years of age, then have this test every 5 years. Have your cholesterol levels checked more often if:  Your lipid or cholesterol levels are high.  You are older than 31 years of age.  You are at high risk for heart disease. What should I know about cancer screening? Depending on your health history and family history, you may need to have cancer screening at various ages. This may include screening for:  Breast cancer.  Cervical cancer.  Colorectal cancer.  Skin cancer.  Lung cancer. What should I know about heart disease, diabetes, and high blood pressure? Blood pressure and heart disease  High blood pressure causes heart disease and increases the risk of stroke. This is more likely to develop in people who have high blood pressure readings, are of African descent, or are overweight.  Have your blood pressure checked: ? Every 3-5 years if you are 32-57 years of age. ? Every year if you are 54 years old or older. Diabetes Have regular diabetes screenings. This checks your fasting blood sugar level. Have the screening done:  Once every three years after age 43 if you are at a normal weight and have a low risk for diabetes.  More often and at a younger age if you are overweight or have a high risk for diabetes. What should I know about preventing infection? Hepatitis B If you have a higher  risk for hepatitis B, you should be screened for this virus. Talk with your health care provider to find out if you are at risk for hepatitis B infection. Hepatitis C Testing is recommended for:  Everyone born from [birth year] through 1967.  Anyone with known risk factors for hepatitis C. Sexually transmitted infections (STIs)  Get screened for STIs, including gonorrhea and chlamydia, if: ? You are sexually active and are younger than 31 years of age. ? You are older than 31 years of  age and your health care provider tells you that you are at risk for this type of infection. ? Your sexual activity has changed since you were last screened, and you are at increased risk for chlamydia or gonorrhea. Ask your health care provider if you are at risk.  Ask your health care provider about whether you are at high risk for HIV. Your health care provider may recommend a prescription medicine to help prevent HIV infection. If you choose to take medicine to prevent HIV, you should first get tested for HIV. You should then be tested every 3 months for as long as you are taking the medicine. Pregnancy  If you are about to stop having your period (premenopausal) and you may become pregnant, seek counseling before you get pregnant.  Take 400 to 800 micrograms (mcg) of folic acid every day if you become pregnant.  Ask for birth control (contraception) if you want to prevent pregnancy. Osteoporosis and menopause Osteoporosis is a disease in which the bones lose minerals and strength with aging. This can result in bone fractures. If you are 74 years old or older, or if you are at risk for osteoporosis and fractures, ask your health care provider if you should:  Be screened for bone loss.  Take a calcium or vitamin D supplement to lower your risk of fractures.  Be given hormone replacement therapy (HRT) to treat symptoms of menopause. Follow these instructions at home: Lifestyle  Do not use any products that contain nicotine or tobacco, such as cigarettes, e-cigarettes, and chewing tobacco. If you need help quitting, ask your health care provider.  Do not use street drugs.  Do not share needles.  Ask your health care provider for help if you need support or information about quitting drugs. Alcohol use  Do not drink alcohol if: ? Your health care provider tells you not to drink. ? You are pregnant, may be pregnant, or are planning to become pregnant.  If you drink alcohol: ? Limit  how much you use to 0-1 drink a day. ? Limit intake if you are breastfeeding.  Be aware of how much alcohol is in your drink. In the [place] one drink equals one 12 oz bottle of beer (355 mL), one 5 oz glass of wine (148 mL), or one 1 oz glass of hard liquor (44 mL). General instructions  Schedule regular health, dental, and eye exams.  Stay current with your vaccines.  Tell your health care provider if: ? You often feel depressed. ? You have ever been abused or do not feel safe at home. Summary  Adopting a healthy lifestyle and getting preventive care are important in promoting health and wellness.  Follow your health care provider's instructions about healthy diet, exercising, and getting tested or screened for diseases.  Follow your health care provider's instructions on monitoring your cholesterol and blood pressure. This information is not intended to replace advice given to you by your health care provider. Make sure you discuss  any questions you have with your health care provider. Document Revised: 06/10/2018 Document Reviewed: 06/10/2018 Elsevier Patient Education  2020 [employer].
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
## 2020-03-11 LAB — COMPREHENSIVE METABOLIC PANEL
AG Ratio: 1.4 (calc) (ref 1.0–2.5)
ALT: 140 U/L — ABNORMAL HIGH (ref 6–29)
AST: 95 U/L — ABNORMAL HIGH (ref 10–30)
Albumin: 4.2 g/dL (ref 3.6–5.1)
Alkaline phosphatase (APISO): 56 U/L (ref 31–125)
BUN: 10 mg/dL (ref 7–25)
CO2: 23 mmol/L (ref 20–32)
Calcium: 9.7 mg/dL (ref 8.6–10.2)
Chloride: 103 mmol/L (ref 98–110)
Creat: 0.51 mg/dL (ref 0.50–1.10)
Globulin: 3 g/dL (calc) (ref 1.9–3.7)
Glucose, Bld: 155 mg/dL — ABNORMAL HIGH (ref 65–99)
Potassium: 4.8 mmol/L (ref 3.5–5.3)
Sodium: 137 mmol/L (ref 135–146)
Total Bilirubin: 0.5 mg/dL (ref 0.2–1.2)
Total Protein: 7.2 g/dL (ref 6.1–8.1)

## 2020-03-11 LAB — CBC WITH DIFFERENTIAL/PLATELET
Absolute Monocytes: 567 cells/uL (ref 200–950)
Basophils Absolute: 38 cells/uL (ref 0–200)
Basophils Relative: 0.7 %
Eosinophils Absolute: 151 cells/uL (ref 15–500)
Eosinophils Relative: 2.8 %
HCT: 45 % (ref 35.0–45.0)
Hemoglobin: 15.3 g/dL (ref 11.7–15.5)
Lymphs Abs: 2214 cells/uL (ref 850–3900)
MCH: 30.7 pg (ref 27.0–33.0)
MCHC: 34 g/dL (ref 32.0–36.0)
MCV: 90.4 fL (ref 80.0–100.0)
MPV: 10.5 fL (ref 7.5–12.5)
Monocytes Relative: 10.5 %
Neutro Abs: 2430 cells/uL (ref 1500–7800)
Neutrophils Relative %: 45 %
Platelets: 309 10*3/uL (ref 140–400)
RBC: 4.98 10*6/uL (ref 3.80–5.10)
RDW: 12.9 % (ref 11.0–15.0)
Total Lymphocyte: 41 %
WBC: 5.4 10*3/uL (ref 3.8–10.8)

## 2020-03-11 LAB — LIPID PANEL
Cholesterol: 183 mg/dL (ref <200)
HDL: 32 mg/dL — ABNORMAL LOW (ref >=50)
LDL Cholesterol (Calc): 120 mg/dL (calc) — ABNORMAL HIGH
Non-HDL Cholesterol (Calc): 151 mg/dL (calc) — ABNORMAL HIGH (ref <130)
Total CHOL/HDL Ratio: 5.7 (calc) — ABNORMAL HIGH (ref <5.0)
Triglycerides: 191 mg/dL — ABNORMAL HIGH (ref <150)

## 2020-03-13 ENCOUNTER — Encounter: Payer: Self-pay | Admitting: Physician Assistant

## 2020-03-14 ENCOUNTER — Ambulatory Visit: Payer: Commercial Managed Care - PPO | Admitting: Cardiology

## 2020-03-14 ENCOUNTER — Encounter: Payer: Self-pay | Admitting: *Deleted

## 2020-03-14 ENCOUNTER — Other Ambulatory Visit: Payer: Self-pay

## 2020-03-14 ENCOUNTER — Encounter: Payer: Self-pay | Admitting: Cardiology

## 2020-03-14 DIAGNOSIS — R002 Palpitations: Secondary | ICD-10-CM | POA: Diagnosis not present

## 2020-03-14 NOTE — Patient Instructions (Addendum)
Medication Instructions:  Your physician recommends that you continue on your current medications as directed. Please refer to the Current Medication list given to you today.  Labwork: None ordered.  Testing/Procedures: None ordered.  Follow-Up: Your physician wants you to follow-up in:  with Dr. Lalla Brothers based on heart monitor results.  Any Other Special Instructions Will Be Listed Below (If Applicable).  If you need a refill on your cardiac medications before your next appointment, please call your pharmacy.   ZIO XT- Long Term Monitor Instructions   Your physician has requested you wear your ZIO patch monitor_14_days.   This is a single patch monitor.  [employer] supplies one patch monitor per enrollment.  Additional stickers are not available.   Please do not apply patch if you will be having a Nuclear Stress Test, Echocardiogram, Cardiac CT, MRI, or Chest Xray during the time frame you would be wearing the monitor. The patch cannot be worn during these tests.  You cannot remove and re-apply the ZIO XT patch monitor.   Your ZIO patch monitor will be sent USPS Priority mail from [facility] directly to your home address. The monitor may also be mailed to a PO BOX if home delivery is not available.   It may take 3-5 days to receive your monitor after you have been enrolled.   Once you have received you monitor, please review enclosed instructions.  Your monitor has already been registered assigning a specific monitor serial # to you.   Applying the monitor   Shave hair from upper left chest.   Hold abrader disc by orange tab.  Rub abrader in 40 strokes over left upper chest as indicated in your monitor instructions.   Clean area with 4 enclosed alcohol pads .  Use all pads to assure are is cleaned thoroughly.  Let dry.   Apply patch as indicated in monitor instructions.  Patch will be place under collarbone on left side of chest with arrow pointing upward.   Rub patch  adhesive wings for 2 minutes.Remove white label marked "1".  Remove white label marked "2".  Rub patch adhesive wings for 2 additional minutes.   While looking in a mirror, press and release button in center of patch.  A small green light will flash 3-4 times .  This will be your only indicator the monitor has been turned on.     Do not shower for the first 24 hours.  You may shower after the first 24 hours.   Press button if you feel a symptom. You will hear a small click.  Record Date, Time and Symptom in the Patient Log Book.   When you are ready to remove patch, follow instructions on last 2 pages of Patient Log Book.  Stick patch monitor onto last page of Patient Log Book.   Place Patient Log Book in [place] box.  Use locking tab on box and tape box closed securely.  The [employer] and [employer] has [employer] on it.  Please place in mailbox as soon as possible.  Your physician should have your test results approximately 7 days after the monitor has been mailed back to [facility].   Call [facility] Customer Care at [phone] if you have questions regarding your ZIO XT patch monitor.  Call them immediately if you see an orange light blinking on your monitor.   If your monitor falls off in less than 4 days contact our Monitor department at [phone].  If your monitor becomes loose  or falls off after 4 days call [employer] at [phone] for suggestions on securing your monitor.
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
## 2020-03-14 NOTE — Progress Notes (Signed)
Patient ID: Heidi Ellison, female   DOB: 1991-04-03, 29 y.o.   MRN: [id number] Patient enrolled for [employer] to ship a 14 day ZIO XT long term holter monitor to her home.

## 2020-03-18 ENCOUNTER — Ambulatory Visit (INDEPENDENT_AMBULATORY_CARE_PROVIDER_SITE_OTHER): Payer: Commercial Managed Care - PPO

## 2020-03-18 DIAGNOSIS — R002 Palpitations: Secondary | ICD-10-CM | POA: Diagnosis not present

## 2020-03-20 ENCOUNTER — Telehealth: Payer: Self-pay | Admitting: Cardiology

## 2020-03-20 NOTE — Telephone Encounter (Signed)
Pt sent a message per pt schedule stating "Hi! I received the Zio machine and placed it on. However I seem to be having a reaction to the adhesive glue or take. It's irritated and itches a lot. Is there anything I can do to help the irritation?" Please advise.

## 2020-04-11 ENCOUNTER — Other Ambulatory Visit: Payer: Self-pay

## 2020-04-11 ENCOUNTER — Ambulatory Visit (INDEPENDENT_AMBULATORY_CARE_PROVIDER_SITE_OTHER): Payer: Commercial Managed Care - PPO | Admitting: Physician Assistant

## 2020-04-11 ENCOUNTER — Encounter: Payer: Self-pay | Admitting: Physician Assistant

## 2020-04-11 VITALS — BP 118/80 | HR 81 | Temp 98.5°F | Ht 62.0 in | Wt 248.0 lb

## 2020-04-11 DIAGNOSIS — E8881 Metabolic syndrome: Secondary | ICD-10-CM

## 2020-04-11 DIAGNOSIS — E88819 Insulin resistance, unspecified: Secondary | ICD-10-CM

## 2020-04-11 DIAGNOSIS — R7989 Other specified abnormal findings of blood chemistry: Secondary | ICD-10-CM

## 2020-04-11 NOTE — Patient Instructions (Signed)
It was great to see you!  Increase Trulicity to 1.5 mg weekly.  Update labs today.  Let's follow-up in 2 months, sooner if you have concerns.  Take care,  Jarold Motto PA-C
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
## 2020-04-12 ENCOUNTER — Encounter: Payer: Self-pay | Admitting: Physician Assistant

## 2020-04-12 LAB — HEPATIC FUNCTION PANEL
AG Ratio: 1.4 (calc) (ref 1.0–2.5)
ALT: 81 U/L — ABNORMAL HIGH (ref 6–29)
AST: 45 U/L — ABNORMAL HIGH (ref 10–30)
Albumin: 4 g/dL (ref 3.6–5.1)
Alkaline phosphatase (APISO): 54 U/L (ref 31–125)
Bilirubin, Direct: 0.1 mg/dL (ref 0.0–0.2)
Globulin: 2.9 g/dL (calc) (ref 1.9–3.7)
Indirect Bilirubin: 0.3 mg/dL (calc) (ref 0.2–1.2)
Total Bilirubin: 0.4 mg/dL (ref 0.2–1.2)
Total Protein: 6.9 g/dL (ref 6.1–8.1)

## 2020-05-01 LAB — TSH: TSH: 0.01 — AB (ref 0.41–5.90)

## 2020-05-01 LAB — HM HEPATITIS C SCREENING LAB: HM Hepatitis Screen: NEGATIVE

## 2020-05-01 LAB — TESTOSTERONE: Testosterone: 42

## 2020-05-01 LAB — HM HIV SCREENING LAB: HM HIV Screening: NEGATIVE

## 2020-05-04 ENCOUNTER — Telehealth: Payer: Self-pay

## 2020-05-04 MED ORDER — TRULICITY 1.5 MG/0.5ML ~~LOC~~ SOAJ: 1.5000 mg | SUBCUTANEOUS | 2 refills | Status: DC

## 2020-05-04 NOTE — Telephone Encounter (Signed)
Heidi Ellison is calling in saying she was here last on 10/12 and Samantha told her that she was going to increase the trulicity from .75 MG to 1.5 MG but the pharmacy does not have a new prescription validating this.

## 2020-05-04 NOTE — Telephone Encounter (Signed)
Spoke to pt told her I will send Rx to the pharmacy for her. Is it going to [place] on [address]? Pt said please send to [place] on [address]. Told her okay sending now. Pt verbalized understanding.

## 2020-05-15 LAB — TSH: TSH: 0.01 — AB (ref 0.41–5.90)

## 2020-05-19 ENCOUNTER — Encounter: Payer: Self-pay | Admitting: Physician Assistant

## 2020-05-19 ENCOUNTER — Ambulatory Visit (INDEPENDENT_AMBULATORY_CARE_PROVIDER_SITE_OTHER): Payer: Commercial Managed Care - PPO | Admitting: Physician Assistant

## 2020-05-19 ENCOUNTER — Other Ambulatory Visit: Payer: Self-pay

## 2020-05-19 VITALS — BP 102/70 | HR 94 | Temp 98.1°F | Ht 62.0 in | Wt 245.2 lb

## 2020-05-19 DIAGNOSIS — E0590 Thyrotoxicosis, unspecified without thyrotoxic crisis or storm: Secondary | ICD-10-CM

## 2020-05-19 DIAGNOSIS — E119 Type 2 diabetes mellitus without complications: Secondary | ICD-10-CM | POA: Diagnosis not present
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
## 2020-05-19 NOTE — Patient Instructions (Signed)
It was great to see you!  Referral to endocrinology placed today  Send [place] any forms for work if needed  Take care,  Jarold Motto PA-C
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
## 2020-06-12 ENCOUNTER — Ambulatory Visit: Payer: Commercial Managed Care - PPO | Admitting: Physician Assistant

## 2020-07-17 ENCOUNTER — Telehealth: Payer: Commercial Managed Care - PPO | Admitting: Physician Assistant

## 2020-07-20 ENCOUNTER — Other Ambulatory Visit: Payer: Self-pay

## 2020-07-20 ENCOUNTER — Encounter: Payer: Self-pay | Admitting: Internal Medicine

## 2020-07-20 ENCOUNTER — Encounter: Payer: Self-pay | Admitting: Physician Assistant

## 2020-07-20 ENCOUNTER — Ambulatory Visit (INDEPENDENT_AMBULATORY_CARE_PROVIDER_SITE_OTHER): Payer: Commercial Managed Care - PPO | Admitting: Internal Medicine

## 2020-07-20 ENCOUNTER — Telehealth: Payer: Commercial Managed Care - PPO | Admitting: Physician Assistant

## 2020-07-20 VITALS — BP 132/76 | HR 100 | Ht 62.0 in | Wt 245.5 lb

## 2020-07-20 DIAGNOSIS — E0590 Thyrotoxicosis, unspecified without thyrotoxic crisis or storm: Secondary | ICD-10-CM | POA: Diagnosis not present

## 2020-07-20 LAB — CBC WITH DIFFERENTIAL/PLATELET
Basophils Absolute: 0 10*3/uL (ref 0.0–0.1)
Basophils Relative: 0.7 % (ref 0.0–3.0)
Eosinophils Absolute: 0.1 10*3/uL (ref 0.0–0.7)
Eosinophils Relative: 1.5 % (ref 0.0–5.0)
HCT: 43.6 % (ref 36.0–46.0)
Hemoglobin: 14.6 g/dL (ref 12.0–15.0)
Lymphocytes Relative: 37.1 % (ref 12.0–46.0)
Lymphs Abs: 2.4 10*3/uL (ref 0.7–4.0)
MCHC: 33.6 g/dL (ref 30.0–36.0)
MCV: 88.9 fl (ref 78.0–100.0)
Monocytes Absolute: 0.5 10*3/uL (ref 0.1–1.0)
Monocytes Relative: 7.1 % (ref 3.0–12.0)
Neutro Abs: 3.5 10*3/uL (ref 1.4–7.7)
Neutrophils Relative %: 53.6 % (ref 43.0–77.0)
Platelets: 311 10*3/uL (ref 150.0–400.0)
RBC: 4.9 Mil/uL (ref 3.87–5.11)
RDW: 13.9 % (ref 11.5–15.5)
WBC: 6.5 10*3/uL (ref 4.0–10.5)

## 2020-07-20 LAB — COMPREHENSIVE METABOLIC PANEL
ALT: 27 U/L (ref 0–35)
AST: 19 U/L (ref 0–37)
Albumin: 4.3 g/dL (ref 3.5–5.2)
Alkaline Phosphatase: 57 U/L (ref 39–117)
BUN: 9 mg/dL (ref 6–23)
CO2: 25 mEq/L (ref 19–32)
Calcium: 9.4 mg/dL (ref 8.4–10.5)
Chloride: 105 mEq/L (ref 96–112)
Creatinine, Ser: 0.61 mg/dL (ref 0.40–1.20)
GFR: 118.64 mL/min (ref >60.00)
Glucose, Bld: 95 mg/dL (ref 70–99)
Potassium: 4.3 mEq/L (ref 3.5–5.1)
Sodium: 137 mEq/L (ref 135–145)
Total Bilirubin: 0.4 mg/dL (ref 0.2–1.2)
Total Protein: 7.6 g/dL (ref 6.0–8.3)

## 2020-07-20 LAB — T4, FREE: Free T4: 0.94 ng/dL (ref 0.60–1.60)

## 2020-07-20 LAB — TSH: TSH: <0.01 u[IU]/mL — ABNORMAL LOW (ref 0.35–4.50)

## 2020-07-20 NOTE — Patient Instructions (Signed)
We recommend that you follow these hyperthyroidism instructions at home:  1) Take Methimazole   If you develop severe sore throat with high fevers OR develop unexplained yellowing of your skin, eyes, under your tongue, severe abdominal pain with nausea or vomiting --> then please get evaluated immediately.  2) Get repeat thyroid labs in 6 weeks .   It is ESSENTIAL to get follow-up labs to help avoid over or undertreatment of your hyperthyroidism - both of which can be dangerous to your health.

## 2020-07-20 NOTE — Telephone Encounter (Signed)
Spoke to pt told her I have her Accomodation forms ready for pick up. I will put at the front desk for you. Pt verbalized understanding. Copy made to scan to chart.
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
## 2020-07-21 ENCOUNTER — Encounter: Payer: Self-pay | Admitting: Internal Medicine

## 2020-07-21 MED ORDER — METHIMAZOLE 5 MG PO TABS: 10.0000 mg | ORAL_TABLET | Freq: Every day | ORAL | 6 refills | Status: DC

## 2020-07-23 LAB — TRAB (TSH RECEPTOR BINDING ANTIBODY): TRAB: 6.41 IU/L — ABNORMAL HIGH (ref <=2.00)

## 2020-07-23 LAB — T3: T3, Total: 236 ng/dL — ABNORMAL HIGH (ref 76–181)

## 2020-08-16 ENCOUNTER — Telehealth (INDEPENDENT_AMBULATORY_CARE_PROVIDER_SITE_OTHER): Payer: Commercial Managed Care - PPO | Admitting: Physician Assistant

## 2020-08-16 ENCOUNTER — Encounter: Payer: Self-pay | Admitting: Physician Assistant

## 2020-08-16 VITALS — Ht 62.0 in | Wt 240.0 lb

## 2020-08-16 DIAGNOSIS — M7989 Other specified soft tissue disorders: Secondary | ICD-10-CM

## 2020-08-16 DIAGNOSIS — F419 Anxiety disorder, unspecified: Secondary | ICD-10-CM

## 2020-08-16 DIAGNOSIS — R002 Palpitations: Secondary | ICD-10-CM | POA: Diagnosis not present

## 2020-08-16 NOTE — Progress Notes (Signed)
Virtual Visit via Video   I connected with Heidi Ellison on 08/16/20 at  4:00 PM EST by a video enabled telemedicine application and verified that I am speaking with the correct person using two identifiers. Location patient: Home Location provider: [facility], Office Persons participating in the virtual visit: Dailee, [place] PA-C, Corky Mull, LPN   I discussed the limitations of evaluation and management by telemedicine and the availability of in person appointments. The patient expressed understanding and agreed to proceed.  I acted as a Neurosurgeon for [employer], [employer], LPN   Subjective:   HPI:   Anxiety; Palpitations; LE swelling Was in [place] this past weekend seeing family. She traveled 3.5 hours there and back. While there she was overall ok but started having increase in anxiety symptoms since Monday night. She developed some SOB, heart racing. Pt took her anxiety medication yesterday with little relief. Pt said she feels tired. She recently started 10 mg methimazole for her thyroid at the end of January. She is tolerating well and has plans to recheck TSH next month. She did have some palpitations and SOB when she went up stairs today but has not had any since with activity. She has pulse oximeter at home and current readings are 97% with 88 HR.  She states that she has intermittent LE swelling from her hx of R ankle repair. Had some swelling over the weekend and a cramp in her calf but this overall resolved, and is not unusual for her.  Denies: chest pain, nausea, vomiting, fever  Patient's last menstrual period was 08/03/2020.   ROS: See pertinent positives and negatives per HPI.  Patient Active Problem List   Diagnosis Date Noted  . Hyperthyroidism 07/20/2020  . Palpitations 03/14/2020  . Traumatic arthritis of right ankle   . Pain from implanted hardware 09/24/2018  . Post-traumatic osteoarthritis of both ankles 08/31/2018   . Endometriosis 10/24/2017  . Tobacco abuse 10/22/2017  . Arthritis 08/01/2017  . Chronic migraine 04/22/2017  . Migraine without status migrainosus, not intractable 01/11/2016    Social History   Tobacco Use  . Smoking status: Former Smoker    Types: Cigarettes    Quit date: 01/08/2020    Years since quitting: 0.6  . Smokeless tobacco: Never Used  . Tobacco comment: 1-2 cigarettes per day  Substance Use Topics  . Alcohol use: Yes    Comment: Occasionally    Current Outpatient Medications:  .  busPIRone (BUSPAR) 5 MG tablet, Take 1 tablet (5 mg total) by mouth 3 (three) times daily as needed., Disp: 60 tablet, Rfl: 1 .  Dulaglutide (TRULICITY) 1.5 MG/0.5ML SOPN, Inject 1.5 mg into the skin once a week., Disp: 2 mL, Rfl: 2 .  hydrOXYzine (ATARAX/VISTARIL) 25 MG tablet, Take 1 tablet (25 mg total) by mouth at bedtime as needed for anxiety (insomnia). Take one 25 mg tablet 30-60 minutes prior to bedtime for insomnia, anxiety. May increase to two tablets., Disp: 60 tablet, Rfl: 0 .  ibuprofen (ADVIL,MOTRIN) 200 MG tablet, Take 400 mg by mouth every 6 (six) hours as needed for fever or headache (pain). , Disp: , Rfl:  .  meloxicam (MOBIC) 15 MG tablet, Take 15 mg by mouth daily., Disp: , Rfl:  .  methimazole (TAPAZOLE) 5 MG tablet, Take 2 tablets (10 mg total) by mouth daily., Disp: 60 tablet, Rfl: 6 .  SPRINTEC 28 0.25-35 MG-MCG tablet, Take 1 tablet by mouth daily., Disp: , Rfl:  No Known Allergies  Objective:   VITALS: Per patient if applicable, see vitals. GENERAL: Alert, appears well and in no acute distress. HEENT: Atraumatic, conjunctiva clear, no obvious abnormalities on inspection of external nose and ears. NECK: Normal movements of the head and neck. CARDIOPULMONARY: No increased WOB. Speaking in clear sentences. I:E ratio WNL.  MS: Moves all visible extremities without noticeable abnormality. PSYCH: Pleasant and cooperative, well-groomed. Speech normal rate and rhythm.  Affect is appropriate. Insight and judgement are appropriate. Attention is focused, linear, and appropriate.  NEURO: CN grossly intact. Oriented as arrived to appointment on time with no prompting. Moves both UE equally.  SKIN: No obvious lesions, wounds, erythema, or cyanosis noted on face or hands.  Assessment and Plan:   Canyon was seen today for anxiety and shortness of breath.  Diagnoses and all orders for this visit:  Palpitations; Leg swelling; Anxiety She is in NAD on my virtual visit with her today. Difficult to assess big picture given she has baseline issues with palpitations, leg swelling and anxiety. I think her uncontrolled thyroid is also contributing. I advised patient if symptoms worsen or change to go to the ER. Will order blood work for patient for tomorrow (it is the end of the day today and our lab has closed.) Based on blood work and symptom progression will determine most appropriate plan and intervention.  She verbalized understanding to proceed to ER if symptoms change. -     CBC with Differential/Platelet; Future -     Comprehensive metabolic panel; Future -     D-Dimer, Quantitative; Future -     POCT urine pregnancy; Future  I discussed the assessment and treatment plan with the patient. The patient was provided an opportunity to ask questions and all were answered. The patient agreed with the plan and demonstrated an understanding of the instructions.   The patient was advised to call back or seek an in-person evaluation if the symptoms worsen or if the condition fails to improve as anticipated.   CMA or LPN served as scribe during this visit. History, Physical, and Plan performed by medical provider. The above documentation has been reviewed and is accurate and complete.  [place], [place] 08/16/2020

## 2020-08-16 NOTE — Telephone Encounter (Signed)
Called pt and scheduled her for appt at 4:00 PM virtually today.

## 2020-08-17 ENCOUNTER — Other Ambulatory Visit (INDEPENDENT_AMBULATORY_CARE_PROVIDER_SITE_OTHER): Payer: Commercial Managed Care - PPO

## 2020-08-17 ENCOUNTER — Encounter: Payer: Self-pay | Admitting: Physician Assistant

## 2020-08-17 ENCOUNTER — Other Ambulatory Visit: Payer: Self-pay

## 2020-08-17 DIAGNOSIS — R002 Palpitations: Secondary | ICD-10-CM

## 2020-08-17 DIAGNOSIS — M7989 Other specified soft tissue disorders: Secondary | ICD-10-CM

## 2020-08-17 LAB — CBC WITH DIFFERENTIAL/PLATELET
Basophils Absolute: 0.1 10*3/uL (ref 0.0–0.1)
Basophils Relative: 0.9 % (ref 0.0–3.0)
Eosinophils Absolute: 0.2 10*3/uL (ref 0.0–0.7)
Eosinophils Relative: 4.2 % (ref 0.0–5.0)
HCT: 42.3 % (ref 36.0–46.0)
Hemoglobin: 14.4 g/dL (ref 12.0–15.0)
Lymphocytes Relative: 23.3 % (ref 12.0–46.0)
Lymphs Abs: 1.4 10*3/uL (ref 0.7–4.0)
MCHC: 34.1 g/dL (ref 30.0–36.0)
MCV: 89.6 fl (ref 78.0–100.0)
Monocytes Absolute: 0.5 10*3/uL (ref 0.1–1.0)
Monocytes Relative: 8.2 % (ref 3.0–12.0)
Neutro Abs: 3.7 10*3/uL (ref 1.4–7.7)
Neutrophils Relative %: 63.4 % (ref 43.0–77.0)
Platelets: 288 10*3/uL (ref 150.0–400.0)
RBC: 4.72 Mil/uL (ref 3.87–5.11)
RDW: 14.7 % (ref 11.5–15.5)
WBC: 5.8 10*3/uL (ref 4.0–10.5)

## 2020-08-17 LAB — COMPREHENSIVE METABOLIC PANEL
ALT: 58 U/L — ABNORMAL HIGH (ref 0–35)
AST: 39 U/L — ABNORMAL HIGH (ref 0–37)
Albumin: 3.9 g/dL (ref 3.5–5.2)
Alkaline Phosphatase: 64 U/L (ref 39–117)
BUN: 9 mg/dL (ref 6–23)
CO2: 24 mEq/L (ref 19–32)
Calcium: 8.9 mg/dL (ref 8.4–10.5)
Chloride: 103 mEq/L (ref 96–112)
Creatinine, Ser: 0.64 mg/dL (ref 0.40–1.20)
GFR: 117.22 mL/min (ref >60.00)
Glucose, Bld: 121 mg/dL — ABNORMAL HIGH (ref 70–99)
Potassium: 4.2 mEq/L (ref 3.5–5.1)
Sodium: 134 mEq/L — ABNORMAL LOW (ref 135–145)
Total Bilirubin: 0.4 mg/dL (ref 0.2–1.2)
Total Protein: 7 g/dL (ref 6.0–8.3)

## 2020-08-17 LAB — D-DIMER, QUANTITATIVE: D-Dimer, Quant: 0.36 mcg/mL FEU (ref <0.50)

## 2020-08-17 LAB — POCT URINE PREGNANCY: Preg Test, Ur: NEGATIVE

## 2020-08-18 ENCOUNTER — Encounter: Payer: Self-pay | Admitting: Internal Medicine

## 2020-08-18 ENCOUNTER — Other Ambulatory Visit: Payer: Self-pay | Admitting: *Deleted

## 2020-08-18 DIAGNOSIS — R7989 Other specified abnormal findings of blood chemistry: Secondary | ICD-10-CM

## 2020-08-18 NOTE — Telephone Encounter (Signed)
Needs OV.  

## 2020-08-21 NOTE — Telephone Encounter (Signed)
Sure. Acute spot may be used. Either 11:30 or 4 pm  Thanks

## 2020-08-21 NOTE — Telephone Encounter (Signed)
Patient called back to schedule, but in order to see her early we would have to use an ACUTE spot.  Dr Lonzo Cloud can you review and advise it you are ok for her to be placed in an acute spot.  Thanks!

## 2020-08-21 NOTE — Telephone Encounter (Signed)
LMTCB to schedule appointment with Dr. Lonzo Cloud

## 2020-08-31 ENCOUNTER — Other Ambulatory Visit: Payer: Self-pay

## 2020-08-31 ENCOUNTER — Other Ambulatory Visit (INDEPENDENT_AMBULATORY_CARE_PROVIDER_SITE_OTHER): Payer: Commercial Managed Care - PPO

## 2020-08-31 DIAGNOSIS — E0590 Thyrotoxicosis, unspecified without thyrotoxic crisis or storm: Secondary | ICD-10-CM

## 2020-08-31 LAB — TSH: TSH: 1.52 u[IU]/mL (ref 0.35–4.50)

## 2020-08-31 LAB — T4, FREE: Free T4: 0.61 ng/dL (ref 0.60–1.60)

## 2020-09-01 ENCOUNTER — Encounter: Payer: Self-pay | Admitting: Internal Medicine

## 2020-09-01 ENCOUNTER — Other Ambulatory Visit: Payer: Self-pay | Admitting: Internal Medicine

## 2020-09-01 MED ORDER — METHIMAZOLE 5 MG PO TABS: 7.5000 mg | ORAL_TABLET | Freq: Every day | ORAL | 1 refills | Status: DC

## 2020-09-20 ENCOUNTER — Ambulatory Visit (INDEPENDENT_AMBULATORY_CARE_PROVIDER_SITE_OTHER): Payer: Commercial Managed Care - PPO | Admitting: Internal Medicine

## 2020-09-20 ENCOUNTER — Encounter: Payer: Self-pay | Admitting: Internal Medicine

## 2020-09-20 ENCOUNTER — Other Ambulatory Visit: Payer: Self-pay

## 2020-09-20 VITALS — BP 122/72 | HR 84 | Resp 18 | Ht 60.0 in | Wt 253.2 lb

## 2020-09-20 DIAGNOSIS — E0590 Thyrotoxicosis, unspecified without thyrotoxic crisis or storm: Secondary | ICD-10-CM

## 2020-09-20 DIAGNOSIS — E0500 Thyrotoxicosis with diffuse goiter without thyrotoxic crisis or storm: Secondary | ICD-10-CM | POA: Insufficient documentation

## 2020-09-20 LAB — T4, FREE: Free T4: 0.69 ng/dL (ref 0.60–1.60)

## 2020-09-20 LAB — TSH: TSH: 4.17 u[IU]/mL (ref 0.35–4.50)
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
## 2020-09-20 NOTE — Patient Instructions (Addendum)
We recommend that you follow these hyperthyroidism instructions at home:  1) Take Methimazole 5 mg, 1.5 tablets daily   If you develop severe sore throat with high fevers OR develop unexplained yellowing of your skin, eyes, under your tongue, severe abdominal pain with nausea or vomiting --> then please get evaluated immediately.  2) Get repeat thyroid labs in 6 weeks .   It is ESSENTIAL to get follow-up labs to help avoid over or undertreatment of your hyperthyroidism - both of which can be dangerous to your health.

## 2020-10-18 ENCOUNTER — Ambulatory Visit (HOSPITAL_COMMUNITY)
Admission: RE | Admit: 2020-10-18 | Discharge: 2020-10-18 | Disposition: A | Discharge status: Home / Self Care | Payer: Commercial Managed Care - PPO | Source: Ambulatory Visit | Attending: Orthopaedic Surgery | Admitting: Orthopaedic Surgery

## 2020-10-18 ENCOUNTER — Other Ambulatory Visit: Payer: Self-pay

## 2020-10-18 ENCOUNTER — Other Ambulatory Visit (HOSPITAL_COMMUNITY): Payer: Self-pay | Admitting: Orthopaedic Surgery

## 2020-10-18 DIAGNOSIS — M7989 Other specified soft tissue disorders: Secondary | ICD-10-CM

## 2020-10-18 DIAGNOSIS — M79661 Pain in right lower leg: Secondary | ICD-10-CM | POA: Diagnosis not present

## 2020-10-19 ENCOUNTER — Ambulatory Visit: Payer: Commercial Managed Care - PPO | Admitting: Internal Medicine

## 2020-10-30 ENCOUNTER — Other Ambulatory Visit: Payer: Self-pay | Admitting: Internal Medicine

## 2020-10-30 DIAGNOSIS — E0590 Thyrotoxicosis, unspecified without thyrotoxic crisis or storm: Secondary | ICD-10-CM

## 2020-11-01 ENCOUNTER — Other Ambulatory Visit (INDEPENDENT_AMBULATORY_CARE_PROVIDER_SITE_OTHER): Payer: Commercial Managed Care - PPO

## 2020-11-01 ENCOUNTER — Other Ambulatory Visit: Payer: Self-pay

## 2020-11-01 DIAGNOSIS — E0590 Thyrotoxicosis, unspecified without thyrotoxic crisis or storm: Secondary | ICD-10-CM | POA: Diagnosis not present

## 2020-11-01 LAB — TSH: TSH: 5.68 u[IU]/mL — ABNORMAL HIGH (ref 0.35–4.50)

## 2020-11-01 LAB — T4, FREE: Free T4: 0.61 ng/dL (ref 0.60–1.60)

## 2020-12-13 ENCOUNTER — Encounter: Payer: Self-pay | Admitting: Internal Medicine

## 2020-12-13 ENCOUNTER — Ambulatory Visit (INDEPENDENT_AMBULATORY_CARE_PROVIDER_SITE_OTHER): Payer: Commercial Managed Care - PPO | Admitting: Internal Medicine

## 2020-12-13 ENCOUNTER — Other Ambulatory Visit: Payer: Self-pay

## 2020-12-13 VITALS — BP 118/64 | HR 102 | Ht 60.0 in | Wt 254.0 lb

## 2020-12-13 DIAGNOSIS — E0590 Thyrotoxicosis, unspecified without thyrotoxic crisis or storm: Secondary | ICD-10-CM | POA: Diagnosis not present

## 2020-12-13 DIAGNOSIS — E0500 Thyrotoxicosis with diffuse goiter without thyrotoxic crisis or storm: Secondary | ICD-10-CM

## 2020-12-13 LAB — T4, FREE: Free T4: 0.72 ng/dL (ref 0.60–1.60)

## 2020-12-13 LAB — TSH: TSH: 3.69 u[IU]/mL (ref 0.35–4.50)
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
## 2020-12-21 ENCOUNTER — Ambulatory Visit: Payer: Commercial Managed Care - PPO | Admitting: Internal Medicine

## 2021-02-07 ENCOUNTER — Other Ambulatory Visit: Payer: Commercial Managed Care - PPO

## 2021-02-12 ENCOUNTER — Other Ambulatory Visit: Payer: Medicaid Other

## 2021-02-20 ENCOUNTER — Other Ambulatory Visit (INDEPENDENT_AMBULATORY_CARE_PROVIDER_SITE_OTHER): Payer: 59

## 2021-02-20 ENCOUNTER — Other Ambulatory Visit: Payer: Self-pay

## 2021-02-20 DIAGNOSIS — E0590 Thyrotoxicosis, unspecified without thyrotoxic crisis or storm: Secondary | ICD-10-CM

## 2021-02-20 LAB — TSH: TSH: 4.15 u[IU]/mL (ref 0.35–5.50)

## 2021-02-20 LAB — T4, FREE: Free T4: 0.83 ng/dL (ref 0.60–1.60)

## 2021-02-22 MED ORDER — METHIMAZOLE 5 MG PO TABS: 2.5000 mg | ORAL_TABLET | Freq: Every day | ORAL | 1 refills | Status: DC

## 2021-03-25 ENCOUNTER — Encounter: Payer: Self-pay | Admitting: Physician Assistant

## 2021-03-26 ENCOUNTER — Ambulatory Visit (HOSPITAL_COMMUNITY)
Admission: RE | Admit: 2021-03-26 | Discharge: 2021-03-26 | Disposition: A | Discharge status: Home / Self Care | Payer: 59 | Source: Ambulatory Visit | Attending: Physician Assistant | Admitting: Physician Assistant

## 2021-03-26 ENCOUNTER — Telehealth: Payer: Self-pay | Admitting: *Deleted

## 2021-03-26 ENCOUNTER — Encounter: Payer: Self-pay | Admitting: Physician Assistant

## 2021-03-26 ENCOUNTER — Ambulatory Visit: Payer: 59 | Admitting: Physician Assistant

## 2021-03-26 ENCOUNTER — Other Ambulatory Visit: Payer: Self-pay

## 2021-03-26 VITALS — BP 126/90 | HR 91 | Temp 97.8°F | Ht 60.0 in | Wt 246.5 lb

## 2021-03-26 DIAGNOSIS — R350 Frequency of micturition: Secondary | ICD-10-CM

## 2021-03-26 DIAGNOSIS — E119 Type 2 diabetes mellitus without complications: Secondary | ICD-10-CM

## 2021-03-26 DIAGNOSIS — M79604 Pain in right leg: Secondary | ICD-10-CM | POA: Diagnosis present

## 2021-03-26 DIAGNOSIS — R519 Headache, unspecified: Secondary | ICD-10-CM

## 2021-03-26 DIAGNOSIS — G8929 Other chronic pain: Secondary | ICD-10-CM

## 2021-03-26 LAB — COMPREHENSIVE METABOLIC PANEL
ALT: 107 U/L — ABNORMAL HIGH (ref 0–35)
AST: 78 U/L — ABNORMAL HIGH (ref 0–37)
Albumin: 4.6 g/dL (ref 3.5–5.2)
Alkaline Phosphatase: 105 U/L (ref 39–117)
BUN: 10 mg/dL (ref 6–23)
CO2: 25 mEq/L (ref 19–32)
Calcium: 9.9 mg/dL (ref 8.4–10.5)
Chloride: 96 mEq/L (ref 96–112)
Creatinine, Ser: 0.76 mg/dL (ref 0.40–1.20)
GFR: 103.49 mL/min (ref >60.00)
Glucose, Bld: 428 mg/dL — ABNORMAL HIGH (ref 70–99)
Potassium: 4.6 mEq/L (ref 3.5–5.1)
Sodium: 132 mEq/L — ABNORMAL LOW (ref 135–145)
Total Bilirubin: 0.8 mg/dL (ref 0.2–1.2)
Total Protein: 7.8 g/dL (ref 6.0–8.3)

## 2021-03-26 LAB — CBC WITH DIFFERENTIAL/PLATELET
Basophils Absolute: 0.1 10*3/uL (ref 0.0–0.1)
Basophils Relative: 1 % (ref 0.0–3.0)
Eosinophils Absolute: 0.2 10*3/uL (ref 0.0–0.7)
Eosinophils Relative: 2.6 % (ref 0.0–5.0)
HCT: 45.3 % (ref 36.0–46.0)
Hemoglobin: 15.1 g/dL — ABNORMAL HIGH (ref 12.0–15.0)
Lymphocytes Relative: 41.4 % (ref 12.0–46.0)
Lymphs Abs: 2.7 10*3/uL (ref 0.7–4.0)
MCHC: 33.3 g/dL (ref 30.0–36.0)
MCV: 91.8 fl (ref 78.0–100.0)
Monocytes Absolute: 0.4 10*3/uL (ref 0.1–1.0)
Monocytes Relative: 6.8 % (ref 3.0–12.0)
Neutro Abs: 3.2 10*3/uL (ref 1.4–7.7)
Neutrophils Relative %: 48.2 % (ref 43.0–77.0)
Platelets: 241 10*3/uL (ref 150.0–400.0)
RBC: 4.93 Mil/uL (ref 3.87–5.11)
RDW: 13.6 % (ref 11.5–15.5)
WBC: 6.6 10*3/uL (ref 4.0–10.5)

## 2021-03-26 LAB — POCT URINALYSIS DIPSTICK
Bilirubin, UA: NEGATIVE
Blood, UA: NEGATIVE
Glucose, UA: POSITIVE — AB
Ketones, UA: POSITIVE
Leukocytes, UA: NEGATIVE
Nitrite, UA: NEGATIVE
Protein, UA: NEGATIVE
Spec Grav, UA: 1.015 (ref 1.010–1.025)
Urobilinogen, UA: 0.2 E.U./dL
pH, UA: 6 (ref 5.0–8.0)

## 2021-03-26 LAB — CK: Total CK: 91 U/L (ref 7–177)

## 2021-03-26 LAB — POCT URINE PREGNANCY: Preg Test, Ur: NEGATIVE

## 2021-03-26 LAB — HEMOGLOBIN A1C: Hgb A1c MFr Bld: 11 % — ABNORMAL HIGH (ref 4.6–6.5)

## 2021-03-26 MED ORDER — KETOROLAC TROMETHAMINE 60 MG/2ML IM SOLN
60.0000 mg | Freq: Once | INTRAMUSCULAR | Status: AC
  Administered 2021-03-26: 60 mg via INTRAMUSCULAR

## 2021-03-26 NOTE — Patient Instructions (Signed)
It was great to see you!  For your headache -- we gave you a toradol injection today  For your increased thirst -- we are checking blood work and your urine   For your ongoing right leg pain -- I will order ultrasound today. If all labs and imaging negative, we will have you follow-up with your foot specialist.  Take care,  Jarold Motto PA-C

## 2021-03-26 NOTE — Telephone Encounter (Signed)
Received a call from Radiology stating that Venous leg ultrasound was negative for deep venous thrombosis (DVT). Report will be uploaded into Epic.
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
## 2021-03-27 ENCOUNTER — Telehealth (INDEPENDENT_AMBULATORY_CARE_PROVIDER_SITE_OTHER): Payer: 59 | Admitting: Physician Assistant

## 2021-03-27 ENCOUNTER — Encounter: Payer: Self-pay | Admitting: Physician Assistant

## 2021-03-27 ENCOUNTER — Other Ambulatory Visit: Payer: Self-pay

## 2021-03-27 ENCOUNTER — Emergency Department (HOSPITAL_COMMUNITY)
Admission: EM | Admit: 2021-03-27 | Discharge: 2021-03-28 | Disposition: A | Discharge status: Home / Self Care | Payer: 59 | Attending: Emergency Medicine | Admitting: Emergency Medicine

## 2021-03-27 DIAGNOSIS — R3589 Other polyuria: Secondary | ICD-10-CM | POA: Diagnosis present

## 2021-03-27 DIAGNOSIS — R7303 Prediabetes: Secondary | ICD-10-CM | POA: Insufficient documentation

## 2021-03-27 DIAGNOSIS — E119 Type 2 diabetes mellitus without complications: Secondary | ICD-10-CM | POA: Diagnosis not present

## 2021-03-27 DIAGNOSIS — R7989 Other specified abnormal findings of blood chemistry: Secondary | ICD-10-CM

## 2021-03-27 DIAGNOSIS — Z7984 Long term (current) use of oral hypoglycemic drugs: Secondary | ICD-10-CM | POA: Insufficient documentation

## 2021-03-27 DIAGNOSIS — Z87891 Personal history of nicotine dependence: Secondary | ICD-10-CM | POA: Insufficient documentation

## 2021-03-27 DIAGNOSIS — N9489 Other specified conditions associated with female genital organs and menstrual cycle: Secondary | ICD-10-CM | POA: Diagnosis not present

## 2021-03-27 DIAGNOSIS — R739 Hyperglycemia, unspecified: Secondary | ICD-10-CM | POA: Insufficient documentation

## 2021-03-27 LAB — URINALYSIS, ROUTINE W REFLEX MICROSCOPIC
Bacteria, UA: NONE SEEN
Bilirubin Urine: NEGATIVE
Glucose, UA: >=500 mg/dL — AB
Hgb urine dipstick: NEGATIVE
Ketones, ur: 20 mg/dL — AB
Leukocytes,Ua: NEGATIVE
Nitrite: NEGATIVE
Protein, ur: NEGATIVE mg/dL
Specific Gravity, Urine: 1.032 — ABNORMAL HIGH (ref 1.005–1.030)
pH: 6 (ref 5.0–8.0)

## 2021-03-27 LAB — URINE CULTURE
MICRO NUMBER:: 12422115
Result:: NO GROWTH
SPECIMEN QUALITY:: ADEQUATE

## 2021-03-27 LAB — CBG MONITORING, ED: Glucose-Capillary: 489 mg/dL — ABNORMAL HIGH (ref 70–99)

## 2021-03-27 MED ORDER — BLOOD GLUCOSE MONITOR KIT: PACK | 0 refills | Status: DC

## 2021-03-27 MED ORDER — METFORMIN HCL 500 MG PO TABS: ORAL_TABLET | ORAL | 1 refills | Status: DC

## 2021-03-27 NOTE — ED Triage Notes (Addendum)
Pt c/o high blood sugar readings today > 400 today. HX T2DM, on oral medications, and Graves Disease. Additional s/s urinary frequency, increased thirst, dizziness, and fatigue. No LOC. Triage CBG 489

## 2021-03-27 NOTE — ED Provider Notes (Signed)
Emergency Medicine Provider Triage Evaluation Note  Heidi Ellison , a 29 y.o. female  was evaluated in triage.  Pt complains of elevated blood sugars.  Patient has history of prediabetes and was recently diagnosed as a type II diabetic after she presented to her PCPs office yesterday for headache, nausea, polydipsia, polyuria, brief episodes of blurry vision.  She was previously on Trulicity for prediabetes, but was transitioned to metformin and Rybelsus.  She had a video visit with her PCP today.  However, she checked her blood sugar at home earlier and it was 410.  She called back and spoke to the triage nurse 2 additional times and blood sugar continue to increase and she was advised to come to the ER for further evaluation.  She also has a history of Graves' disease and takes methimazole.  Reports that her last labs in June were normal and her dose was decreased to 2.5 mg daily.  She does report that she has been having increased diaphoresis and tachycardia along with her other symptoms over the last few weeks.  She had a brief episode of epigastric abdominal pain over the weekend, but this is since resolved.  No vomiting, confusion, fever, chills.  Review of Systems  Positive: Polyuria, polydipsia, headache, nausea, epigastric pain, diaphoresis, palpitations, polyuria Negative: Shortness of breath, chest pain, confusion, fever, chills, rash  Physical Exam  BP (!) 156/104 (BP Location: Left Arm)   Pulse 62   Temp 98.1 F (36.7 C) (Oral)   Resp 20   Ht 5' (1.524 m)   Wt 111.6 kg   LMP 03/09/2021   SpO2 96%   BMI 48.04 kg/m  Gen:   Awake, no distress   Resp:  Normal effort  MSK:   Moves extremities without difficulty  Other:  Abdomen is nontender.  No distention.  Medical Decision Making  Medically screening exam initiated at 10:17 PM.  Appropriate orders placed.  Heidi Ellison was informed that the remainder of the evaluation will be completed by another provider, this initial  triage assessment does not replace that evaluation, and the importance of remaining in the ED until their evaluation is complete.  Labs and imaging have been ordered.  Given tachycardia and diaphoresis recently, we will also recheck the patient's thyroid panel.  She will require further work-up evaluation in the emergency department.   Barkley Boards, PA-C 03/27/21 2227    Koleen Distance, MD 03/27/21 2230

## 2021-03-27 NOTE — Progress Notes (Signed)
Virtual Visit via Video   I connected with Heidi Ellison on 03/27/21 at 11:30 AM EDT by a video enabled telemedicine application and verified that I am speaking with the correct person using two identifiers. Location patient: Home Location provider:  [facility], Office Persons participating in the virtual visit: Heidi Ellison, [place] PA-C  I discussed the limitations of evaluation and management by telemedicine and the availability of in person appointments. The patient expressed understanding and agreed to proceed.    Subjective:   HPI:   Diabetes Patient is following up from yesterday's blood work results.  Her hemoglobin A1c is 11%.  She is currently not on any medication.  She has had elevated A1c's in the past, most recently 7.5% last year.  At that time she was prescribed Trulicity, and she took this as directed but only for 2 months.  She states that when she ran out she did not pursue follow-up because she did not like doing weekly injections.  She would like to avoid further injections if possible.  She is also been on metformin in the past, and denies any concerns with this medication.  Elevated liver function tests She has elevated LFTs.  She states that she has had this before however they have improved on their own with diet modifications.  Her most recent abdominal ultrasound was in July 2019 and it did not show any findings at that time.  She denies excessive alcohol intake.  ROS: See pertinent positives and negatives per HPI.  Patient Active Problem List   Diagnosis Date Noted   Graves disease 09/20/2020   Hyperthyroidism 07/20/2020   Palpitations 03/14/2020   Traumatic arthritis of right ankle    Pain from implanted hardware 09/24/2018   Post-traumatic osteoarthritis of both ankles 08/31/2018   Endometriosis 10/24/2017   Tobacco abuse 10/22/2017   Arthritis 08/01/2017   Chronic migraine 04/22/2017   Migraine without status migrainosus, not  intractable 01/11/2016    Social History   Tobacco Use   Smoking status: Former    Types: Cigarettes    Quit date: 01/08/2020    Years since quitting: 1.2   Smokeless tobacco: Never   Tobacco comments:    1-2 cigarettes per day  Substance Use Topics   Alcohol use: Yes    Comment: Occasionally    Current Outpatient Medications:    blood glucose meter kit and supplies KIT, Dispense based on patient and insurance preference. Use up to four times daily as directed., Disp: 1 each, Rfl: 0   busPIRone (BUSPAR) 5 MG tablet, Take 1 tablet (5 mg total) by mouth 3 (three) times daily as needed., Disp: 60 tablet, Rfl: 1   hydrOXYzine (ATARAX/VISTARIL) 25 MG tablet, Take 1 tablet (25 mg total) by mouth at bedtime as needed for anxiety (insomnia). Take one 25 mg tablet 30-60 minutes prior to bedtime for insomnia, anxiety. May increase to two tablets., Disp: 60 tablet, Rfl: 0   ibuprofen (ADVIL,MOTRIN) 200 MG tablet, Take 400 mg by mouth every 6 (six) hours as needed for fever or headache (pain). , Disp: , Rfl:    metFORMIN (GLUCOPHAGE) 500 MG tablet, Start 500 mg daily x2 weeks, then increase to 1000 mg daily., Disp: 60 tablet, Rfl: 1   methimazole (TAPAZOLE) 5 MG tablet, Take 0.5 tablets (2.5 mg total) by mouth daily., Disp: 45 tablet, Rfl: 1   SPRINTEC 28 0.25-35 MG-MCG tablet, Take 1 tablet by mouth daily., Disp: , Rfl:   No Known Allergies  Objective:  VITALS: Per patient if applicable, see vitals. GENERAL: Alert, appears well and in no acute distress. HEENT: Atraumatic, conjunctiva clear, no obvious abnormalities on inspection of external nose and ears. NECK: Normal movements of the head and neck. CARDIOPULMONARY: No increased WOB. Speaking in clear sentences. I:E ratio WNL.  MS: Moves all visible extremities without noticeable abnormality. PSYCH: Pleasant and cooperative, well-groomed. Speech normal rate and rhythm. Affect is appropriate. Insight and judgement are appropriate. Attention  is focused, linear, and appropriate.  NEURO: CN grossly intact. Oriented as arrived to appointment on time with no prompting. Moves both UE equally.  SKIN: No obvious lesions, wounds, erythema, or cyanosis noted on face or hands.  Assessment and Plan:   [place] was seen today for diabetes.  Diagnoses and all orders for this visit:  Diabetes mellitus without complication ([place]) New diagnosis for patient Referral for nutrition therapy Start 500 mg metformin daily, after 2 weeks increase to 1000 mg daily Start 3 mg Rybelsus, sample provided.  I have asked her to let us know if she would like to continue this prescription after taking for about 2 to 3 weeks. I sent her a MyChart message with worsening precautions and what would warrant ER evaluation I have also sent in a glucometer for her to check her blood sugars as needed when she is feeling poorly Follow-up with me in 1 month, sooner if concerns  Elevated LFTs Asymptomatic  we will recheck this at her 1 month follow-up Avoid excessive alcohol and Tylenol  Other orders -     metFORMIN (GLUCOPHAGE) 500 MG tablet; Start 500 mg daily x2 weeks, then increase to 1000 mg daily. -     blood glucose meter kit and supplies KIT; Dispense based on patient and insurance preference. Use up to four times daily as directed.   I discussed the assessment and treatment plan with the patient. The patient was provided an opportunity to ask questions and all were answered. The patient agreed with the plan and demonstrated an understanding of the instructions.   The patient was advised to call back or seek an in-person evaluation if the symptoms worsen or if the condition fails to improve as anticipated.   [place], [place] 03/27/2021

## 2021-03-28 ENCOUNTER — Telehealth: Payer: 59 | Admitting: Physician Assistant

## 2021-03-28 ENCOUNTER — Telehealth: Payer: Self-pay

## 2021-03-28 LAB — COMPREHENSIVE METABOLIC PANEL
ALT: 102 U/L — ABNORMAL HIGH (ref 0–44)
AST: 90 U/L — ABNORMAL HIGH (ref 15–41)
Albumin: 3.9 g/dL (ref 3.5–5.0)
Alkaline Phosphatase: 103 U/L (ref 38–126)
Anion gap: 9 (ref 5–15)
BUN: 10 mg/dL (ref 6–20)
CO2: 24 mmol/L (ref 22–32)
Calcium: 9.4 mg/dL (ref 8.9–10.3)
Chloride: 99 mmol/L (ref 98–111)
Creatinine, Ser: 0.68 mg/dL (ref 0.44–1.00)
GFR, Estimated: >60 mL/min (ref >60)
Glucose, Bld: 475 mg/dL — ABNORMAL HIGH (ref 70–99)
Potassium: 4.5 mmol/L (ref 3.5–5.1)
Sodium: 132 mmol/L — ABNORMAL LOW (ref 135–145)
Total Bilirubin: 0.8 mg/dL (ref 0.3–1.2)
Total Protein: 7 g/dL (ref 6.5–8.1)

## 2021-03-28 LAB — CBC WITH DIFFERENTIAL/PLATELET
Abs Immature Granulocytes: 0.02 10*3/uL (ref 0.00–0.07)
Basophils Absolute: 0.1 10*3/uL (ref 0.0–0.1)
Basophils Relative: 1 %
Eosinophils Absolute: 0.2 10*3/uL (ref 0.0–0.5)
Eosinophils Relative: 3 %
HCT: 44.8 % (ref 36.0–46.0)
Hemoglobin: 15.2 g/dL — ABNORMAL HIGH (ref 12.0–15.0)
Immature Granulocytes: 0 %
Lymphocytes Relative: 45 %
Lymphs Abs: 3 10*3/uL (ref 0.7–4.0)
MCH: 30.8 pg (ref 26.0–34.0)
MCHC: 33.9 g/dL (ref 30.0–36.0)
MCV: 90.9 fL (ref 80.0–100.0)
Monocytes Absolute: 0.4 10*3/uL (ref 0.1–1.0)
Monocytes Relative: 6 %
Neutro Abs: 3 10*3/uL (ref 1.7–7.7)
Neutrophils Relative %: 45 %
Platelets: 258 10*3/uL (ref 150–400)
RBC: 4.93 MIL/uL (ref 3.87–5.11)
RDW: 13 % (ref 11.5–15.5)
WBC: 6.6 10*3/uL (ref 4.0–10.5)
nRBC: 0 % (ref 0.0–0.2)

## 2021-03-28 LAB — BETA-HYDROXYBUTYRIC ACID: Beta-Hydroxybutyric Acid: 0.64 mmol/L — ABNORMAL HIGH (ref 0.05–0.27)

## 2021-03-28 LAB — T4, FREE: Free T4: 0.95 ng/dL (ref 0.61–1.12)

## 2021-03-28 LAB — CBG MONITORING, ED: Glucose-Capillary: 303 mg/dL — ABNORMAL HIGH (ref 70–99)

## 2021-03-28 LAB — I-STAT BETA HCG BLOOD, ED (MC, WL, AP ONLY): I-stat hCG, quantitative: <5 m[IU]/mL (ref <5)

## 2021-03-28 LAB — TSH: TSH: 6.554 u[IU]/mL — ABNORMAL HIGH (ref 0.350–4.500)

## 2021-03-28 MED ORDER — LACTATED RINGERS IV BOLUS
1000.0000 mL | Freq: Once | INTRAVENOUS | Status: AC
  Administered 2021-03-28: 1000 mL via INTRAVENOUS

## 2021-03-28 MED ORDER — INSULIN ASPART 100 UNIT/ML IJ SOLN
10.0000 [IU] | Freq: Once | INTRAMUSCULAR | Status: AC
  Administered 2021-03-28: 10 [IU] via INTRAVENOUS

## 2021-03-28 NOTE — Discharge Instructions (Signed)
Please call your doctor this morning and let them know about your visit here.  Please return for worsening symptoms.  You are slightly hypothyroid based on your TSH.  This is something else her family doctor may want to adjust for you.

## 2021-03-28 NOTE — Telephone Encounter (Signed)
Patient was seen in ED.   Nurse Assessment Nurse: Genevive Bi, RN, Deb Date/Time Lamount Cohen Time): 03/27/2021 6:58:39 PM Confirm and document reason for call. If symptomatic, describe symptoms. ---Caller is getting anxious for a call back. Caller states, sugars are at 410. Just got metformin and another Rx. Check sugar with machine. Feeling light headed and dizzy Does the patient have any new or worsening symptoms? ---Yes Will a triage be completed? ---Yes Related visit to physician within the last 2 weeks? ---N/A Does the PT have any chronic conditions? (i.e. diabetes, asthma, this includes High risk factors for pregnancy, etc.) ---Yes List chronic conditions. ---diabetes, Grave's disease Is the patient pregnant or possibly pregnant? (Ask all females between the ages of 57-55) ---No Is this a behavioral health or substance abuse call? ---No Guidelines Guideline Title Affirmed Question Affirmed Notes Nurse Date/Time (Eastern Time) Diabetes - High Blood Sugar Blood glucose > 400 mg/dL (28.3 mmol/L) Genevive Bi, RN, Deb 03/27/2021 6:59:05 PM PLEASE NOTE: All timestamps contained within this report are represented as [place] Standard Time. CONFIDENTIALTY NOTICE: This fax transmission is intended only for the addressee. It contains information that is legally privileged, confidential or otherwise protected from use or disclosure. If you are not the intended recipient, you are strictly prohibited from reviewing, disclosing, copying using or disseminating any of this information or taking any action in reliance on or regarding this information. If you have received this fax in error, please notify us immediately by telephone so that we can arrange for its return to [place]. Phone: [phone], Toll-Free: [phone], Fax: [phone] Page: 2 of 2 Call Id: [id number] Disp. Time Lamount Cohen Time) Disposition Final User 03/27/2021 6:53:03 PM Send To Call Back Waiting For Nurse Loretha Stapler 03/27/2021  7:07:07 PM Paged On Call back to [facility], RN, Reece Levy 03/27/2021 7:31:42 PM Send To RN Personal Genevive Bi, RN, Deb 03/27/2021 7:45:08 PM Paged On Call back to [facility], RN, Deb 03/27/2021 7:53:42 PM Paged On Call back to [facility], RN, Reece Levy 03/27/2021 7:05:31 PM Call PCP Now Yes Genevive Bi, RN, Deb Caller Disagree/Comply Comply Caller Understands Yes PreDisposition InappropriateToAsk Care Advice Given Per Guideline CALL PCP NOW: * You need to discuss this with your doctor (or NP/PA). * I'll page the on-call provider now. If you haven't heard from the provider (or me) within 30 minutes, call again. CALL BACK IF: * Vomiting occurs * Rapid breathing occurs * You become worse CARE ADVICE given per Diabetes - High Blood Sugar (Adult) guideline. Comments User: Deloria Lair, RN Date/Time Lamount Cohen Time): 03/27/2021 7:03:20 PM current blood glucose is 440 Referrals GO TO FACILITY UNDECIDED Paging DoctorName Phone DateTime Result/ Outcome Message Type Notes Duncan Dull - MD [phone] 03/27/2021 7:07:07 PM Called On Call Provider - Left Message Doctor Paged Janan Ridge [phone] 03/27/2021 7:45:08 PM Called On Call Provider - Left Message Doctor Paged Janan Ridge [phone] 03/27/2021 7:53:42 PM Called on Call provider - No message left Doctor Paged Janan Ridge 03/27/2021 7:53:56 PM Unable to Reach on call - Max Attempts Message Result advised caller to go to ED

## 2021-03-28 NOTE — ED Notes (Signed)
Pt discharged and ambulated out of the ED without difficulty. 

## 2021-03-28 NOTE — Telephone Encounter (Signed)
FYI
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
## 2021-03-29 LAB — T3, FREE: T3, Free: 3.3 pg/mL (ref 2.0–4.4)

## 2021-04-02 ENCOUNTER — Other Ambulatory Visit: Payer: Self-pay | Admitting: Physician Assistant

## 2021-04-02 DIAGNOSIS — E119 Type 2 diabetes mellitus without complications: Secondary | ICD-10-CM

## 2021-04-09 ENCOUNTER — Other Ambulatory Visit: Payer: Self-pay | Admitting: Physician Assistant

## 2021-04-09 DIAGNOSIS — E119 Type 2 diabetes mellitus without complications: Secondary | ICD-10-CM

## 2021-04-09 DIAGNOSIS — H538 Other visual disturbances: Secondary | ICD-10-CM

## 2021-04-09 LAB — HM DIABETES EYE EXAM

## 2021-04-16 ENCOUNTER — Ambulatory Visit: Payer: Commercial Managed Care - PPO | Admitting: Internal Medicine

## 2021-04-23 ENCOUNTER — Encounter: Payer: Self-pay | Admitting: Physician Assistant

## 2021-05-03 ENCOUNTER — Other Ambulatory Visit: Payer: Self-pay | Admitting: Physician Assistant

## 2021-05-03 ENCOUNTER — Encounter: Payer: Self-pay | Admitting: Physician Assistant

## 2021-05-03 NOTE — Telephone Encounter (Signed)
Patient is already schedule on 05/08/21. Do we need to get her in sooner?

## 2021-05-04 NOTE — Telephone Encounter (Signed)
Left message on voicemail to call office. Will send My chart message.
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
## 2021-05-08 ENCOUNTER — Encounter: Payer: Self-pay | Admitting: Physician Assistant

## 2021-05-08 ENCOUNTER — Other Ambulatory Visit: Payer: Self-pay

## 2021-05-08 ENCOUNTER — Ambulatory Visit (INDEPENDENT_AMBULATORY_CARE_PROVIDER_SITE_OTHER): Payer: 59 | Admitting: Physician Assistant

## 2021-05-08 VITALS — BP 118/80 | HR 92 | Temp 98.0°F | Ht 60.0 in | Wt 248.0 lb

## 2021-05-08 DIAGNOSIS — R748 Abnormal levels of other serum enzymes: Secondary | ICD-10-CM

## 2021-05-08 DIAGNOSIS — E119 Type 2 diabetes mellitus without complications: Secondary | ICD-10-CM | POA: Diagnosis not present

## 2021-05-08 DIAGNOSIS — G4733 Obstructive sleep apnea (adult) (pediatric): Secondary | ICD-10-CM

## 2021-05-08 DIAGNOSIS — F419 Anxiety disorder, unspecified: Secondary | ICD-10-CM | POA: Diagnosis not present

## 2021-05-08 DIAGNOSIS — Z23 Encounter for immunization: Secondary | ICD-10-CM | POA: Diagnosis not present

## 2021-05-08 DIAGNOSIS — Z0000 Encounter for general adult medical examination without abnormal findings: Secondary | ICD-10-CM | POA: Diagnosis not present

## 2021-05-08 LAB — COMPREHENSIVE METABOLIC PANEL
ALT: 120 U/L — ABNORMAL HIGH (ref 0–35)
AST: 78 U/L — ABNORMAL HIGH (ref 0–37)
Albumin: 4.5 g/dL (ref 3.5–5.2)
Alkaline Phosphatase: 76 U/L (ref 39–117)
BUN: 12 mg/dL (ref 6–23)
CO2: 29 mEq/L (ref 19–32)
Calcium: 10.1 mg/dL (ref 8.4–10.5)
Chloride: 100 mEq/L (ref 96–112)
Creatinine, Ser: 0.63 mg/dL (ref 0.40–1.20)
GFR: 117.07 mL/min (ref >60.00)
Glucose, Bld: 197 mg/dL — ABNORMAL HIGH (ref 70–99)
Potassium: 4.5 mEq/L (ref 3.5–5.1)
Sodium: 138 mEq/L (ref 135–145)
Total Bilirubin: 0.5 mg/dL (ref 0.2–1.2)
Total Protein: 7.7 g/dL (ref 6.0–8.3)

## 2021-05-08 LAB — LIPID PANEL
Cholesterol: 199 mg/dL (ref 0–200)
HDL: 37.5 mg/dL — ABNORMAL LOW (ref >39.00)
LDL Cholesterol: 125 mg/dL — ABNORMAL HIGH (ref 0–99)
NonHDL: 161.86
Total CHOL/HDL Ratio: 5
Triglycerides: 184 mg/dL — ABNORMAL HIGH (ref 0.0–149.0)
VLDL: 36.8 mg/dL (ref 0.0–40.0)

## 2021-05-08 LAB — MICROALBUMIN / CREATININE URINE RATIO
Creatinine,U: 53.1 mg/dL
Microalb Creat Ratio: 3.2 mg/g (ref 0.0–30.0)
Microalb, Ur: 1.7 mg/dL (ref 0.0–1.9)

## 2021-05-08 MED ORDER — RYBELSUS 7 MG PO TABS: 7.0000 mg | ORAL_TABLET | Freq: Every day | ORAL | 0 refills | Status: DC

## 2021-05-08 NOTE — Patient Instructions (Addendum)
It was great to see you!  For your diabetes  Blood sugars improving! Continue Metformin 1000 mg daily Increase Rybelsus to 7 mg daily Follow-up after January 1st for recheck of your A1c  For your anxiety Let us know if you need referral for talk therapy Continue buspar and hydroxyzine as needed  For your sleep apnea You will be contacted about your referral to the neurologist for further evaluation   Please go to the lab for blood work.   Our office will call you with your results unless you have chosen to receive results via MyChart.  If your blood work is normal we will follow-up each year for physicals and as scheduled for chronic medical problems.  If anything is abnormal we will treat accordingly and get you in for a follow-up.  Take care,  [name]

## 2021-05-09 ENCOUNTER — Encounter: Payer: Self-pay | Admitting: Internal Medicine

## 2021-05-09 ENCOUNTER — Ambulatory Visit (INDEPENDENT_AMBULATORY_CARE_PROVIDER_SITE_OTHER): Payer: 59 | Admitting: Internal Medicine

## 2021-05-09 VITALS — BP 130/72 | HR 93 | Ht 60.0 in | Wt 248.0 lb

## 2021-05-09 DIAGNOSIS — E0500 Thyrotoxicosis with diffuse goiter without thyrotoxic crisis or storm: Secondary | ICD-10-CM

## 2021-05-09 DIAGNOSIS — E1165 Type 2 diabetes mellitus with hyperglycemia: Secondary | ICD-10-CM

## 2021-05-09 DIAGNOSIS — E0590 Thyrotoxicosis, unspecified without thyrotoxic crisis or storm: Secondary | ICD-10-CM | POA: Diagnosis not present

## 2021-05-09 LAB — POCT GLYCOSYLATED HEMOGLOBIN (HGB A1C): Hemoglobin A1C: 9.3 % — AB (ref 4.0–5.6)

## 2021-05-09 LAB — T4, FREE: Free T4: 0.89 ng/dL (ref 0.60–1.60)

## 2021-05-09 LAB — TSH: TSH: 4.04 u[IU]/mL (ref 0.35–5.50)

## 2021-05-09 NOTE — Patient Instructions (Signed)
-   Start Rybelsus 7 mg daily  - After a month of taking Rybelsus , if no side effects please increase Metformin as below  - Continue Metformin 2 tablet daily with Breakfast, increase to 1 tablet with Supper for  1 week, then increase to 2 tablets with Breakfast  and 2 tablet with Supper
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
## 2021-05-10 MED ORDER — METFORMIN HCL 500 MG PO TABS: 1000.0000 mg | ORAL_TABLET | Freq: Two times a day (BID) | ORAL | 3 refills | Status: DC

## 2021-05-15 ENCOUNTER — Ambulatory Visit
Admission: RE | Admit: 2021-05-15 | Discharge: 2021-05-15 | Disposition: A | Discharge status: Home / Self Care | Payer: 59 | Source: Ambulatory Visit | Attending: Physician Assistant | Admitting: Physician Assistant

## 2021-05-15 DIAGNOSIS — R748 Abnormal levels of other serum enzymes: Secondary | ICD-10-CM

## 2021-05-23 ENCOUNTER — Other Ambulatory Visit: Payer: Self-pay

## 2021-05-23 ENCOUNTER — Encounter: Payer: 59 | Attending: Physician Assistant | Admitting: Dietician

## 2021-05-23 ENCOUNTER — Encounter: Payer: Self-pay | Admitting: Dietician

## 2021-05-23 DIAGNOSIS — E1165 Type 2 diabetes mellitus with hyperglycemia: Secondary | ICD-10-CM | POA: Diagnosis present

## 2021-05-23 NOTE — Patient Instructions (Addendum)
Take your metformin on a full stomach after eating.   Work towards eating three meals a day, about 5-6 hours apart!  Begin to recognize carbohydrates in your food choices!  Begin to build your meals using the proportions of the Balanced Plate. First, select your carb choice(starches/grains) for the meal,  Next, select your source of protein to pair with your carb choice(starches/grains). Finally, complete the remaining half of your meal with a variety of non-starchy vegetables.  Check your blood sugar each morning before eating or drinking (fasting). Look for numbers under 125 mg/dL Check your blood sugar 2 hours after you begin eating a meal. Look for numbers under 180 mg/dL at all times.  Your goal A1c is below 7.0%

## 2021-05-23 NOTE — Progress Notes (Signed)
Diabetes Self-Management Education  Visit Type: First/Initial  Appt. Start Time: 0945 Appt. End Time: 1050  05/23/2021  [name], identified by name and date of birth, is a 30 y.o. female with a diagnosis of Diabetes: Type 2.   ASSESSMENT Pt is taking Rybelsus and metformin for their diabetes. Pt reports occasional nausea in the morning after taking medication. Pt reports losing 4 pounds since starting Rybelsus and has noticed a decrease in appetite.Pt took Trulicity in the past as a prediabetic, then discontinued it. Pt states they believe discontinuing Trulicity may have contributed to their sharp increase in A1c.  Pt was checking BG 4 times a day, is now checking fasting and before bed.  FBG ~ 120, CBG before bed ~ 120-130 Pt reports getting a little overwhelmed recently. Pt will detach themselves and try to stay busy working or cleaning to keep their mind off of things. Pt reports emotional eating during these times as well. Pt reports feeling powerless over their diabetes when they got the diagnosis, but is feeling better now with their medication. Pt has been diagnosed with Grave's disease, states they have had difficulty losing weight in the past and thinks it may be due to that diagnosis. Pt works a sedentary job Mon-Fri. Pt walks their dog for activity 1-2 times a week, but does not do any other physical activity or exercise. Pt cooks meals for themselves and their family, will eat leftovers for lunch most days. Pt reports occasionally missing breakfast or lunch. Pt is working on limiting their SSB consumption, is choosing "Zero" sodas.   Height 5' (1.524 m), weight 250 lb 1.6 oz (113.4 kg). Body mass index is 48.84 kg/m.   Diabetes Self-Management Education - 05/23/21 1008       Visit Information   Visit Type First/Initial      Initial Visit   Diabetes Type Type 2    Are you currently following a meal plan? No    Are you taking your medications as prescribed? Yes     Date Diagnosed 03/26/2021      Health Coping   How would you rate your overall health? Poor   Due to weight     Psychosocial Assessment   Patient Belief/Attitude about Diabetes Motivated to manage diabetes    Self-care barriers None    Self-management support Doctor's office;Family    Other persons present Patient    Patient Concerns Glycemic Control    Special Needs None    Preferred Learning Style No preference indicated    Learning Readiness Ready    How often do you need to have someone help you when you read instructions, pamphlets, or other written materials from your doctor or pharmacy? 1 - Never    What is the last grade level you completed in school? 12th grade      Pre-Education Assessment   Patient understands the diabetes disease and treatment process. Needs Instruction    Patient understands incorporating nutritional management into lifestyle. Needs Instruction    Patient undertands incorporating physical activity into lifestyle. Needs Instruction    Patient understands using medications safely. Needs Instruction    Patient understands monitoring blood glucose, interpreting and using results Needs Instruction    Patient understands prevention, detection, and treatment of acute complications. Needs Instruction    Patient understands prevention, detection, and treatment of chronic complications. Needs Instruction    Patient understands how to develop strategies to address psychosocial issues. Needs Instruction    Patient understands how to  develop strategies to promote health/change behavior. Needs Instruction      Complications   Last HgB A1C per patient/outside source 9.3 %   05/09/2021   How often do you check your blood sugar? 1-2 times/day    Fasting Blood glucose range (mg/dL) 70-129    Postprandial Blood glucose range (mg/dL) 70-129    Have you had a dilated eye exam in the past 12 months? Yes    Have you had a dental exam in the past 12 months? No    Are you  checking your feet? No      Dietary Intake   Breakfast Egg whites, onions, home fries, black beans, water, coffee w/1 sugar and milk    Lunch Plantains, black beans, 2 pieces of bread, lemonade diluted with water    Dinner [employer] bacon cheeseburger, 4 piece nugget, a few fries, coke zero      Exercise   Exercise Type ADL's    How many days per week to you exercise? 0    How many minutes per day do you exercise? 0    Total minutes per week of exercise 0      Patient Education   Previous Diabetes Education No    Disease state  Factors that contribute to the development of diabetes;Explored patient's options for treatment of their diabetes    Nutrition management  Food label reading, portion sizes and measuring food.;Role of diet in the treatment of diabetes and the relationship between the three main macronutrients and blood glucose level;Reviewed blood glucose goals for pre and post meals and how to evaluate the patients' food intake on their blood glucose level.;Information on hints to eating out and maintain blood glucose control.    Physical activity and exercise  Role of exercise on diabetes management, blood pressure control and cardiac health.    Medications Reviewed patients medication for diabetes, action, purpose, timing of dose and side effects.    Monitoring Purpose and frequency of SMBG.;Identified appropriate SMBG and/or A1C goals.    Acute complications Discussed and identified patients' treatment of hyperglycemia.;Taught treatment of hypoglycemia - the 15 rule.    Chronic complications Relationship between chronic complications and blood glucose control    Psychosocial adjustment Role of stress on diabetes;Identified and addressed patients feelings and concerns about diabetes      Individualized Goals (developed by patient)   Nutrition Follow meal plan discussed    Physical Activity Exercise 3-5 times per week    Medications take my medication as prescribed     Monitoring  test my blood glucose as discussed;send in my blood glucose log as discussed      Post-Education Assessment   Patient understands the diabetes disease and treatment process. Needs Review    Patient understands incorporating nutritional management into lifestyle. Needs Review    Patient undertands incorporating physical activity into lifestyle. Needs Review    Patient understands using medications safely. Needs Review    Patient understands monitoring blood glucose, interpreting and using results Needs Review    Patient understands prevention, detection, and treatment of acute complications. Needs Review    Patient understands prevention, detection, and treatment of chronic complications. Needs Review    Patient understands how to develop strategies to address psychosocial issues. Needs Review    Patient understands how to develop strategies to promote health/change behavior. Needs Review      Outcomes   Expected Outcomes Demonstrated interest in learning. Expect positive outcomes    Future DMSE 2 months  Program Status Not Completed             Individualized Plan for Diabetes Self-Management Training:   Learning Objective:  Patient will have a greater understanding of diabetes self-management. Patient education plan is to attend individual and/or group sessions per assessed needs and concerns.   Plan:   Patient Instructions  Take your metformin on a full stomach after eating.   Work towards eating three meals a day, about 5-6 hours apart!  Begin to recognize carbohydrates in your food choices!  Begin to build your meals using the proportions of the Balanced Plate. First, select your carb choice(starches/grains) for the meal,  Next, select your source of protein to pair with your carb choice(starches/grains). Finally, complete the remaining half of your meal with a variety of non-starchy vegetables.  Check your blood sugar each morning before eating or  drinking (fasting). Look for numbers under 125 mg/dL Check your blood sugar 2 hours after you begin eating a meal. Look for numbers under 180 mg/dL at all times.  Your goal A1c is below 7.0%    Expected Outcomes:  Demonstrated interest in learning. Expect positive outcomes  Education material provided: ADA - How to Thrive: A Guide for Your Journey with Diabetes  If problems or questions, patient to contact team via:  Phone and Email  Future DSME appointment: 2 months

## 2021-06-28 ENCOUNTER — Telehealth: Payer: Self-pay

## 2021-06-28 ENCOUNTER — Encounter: Payer: Self-pay | Admitting: Physician Assistant

## 2021-06-28 NOTE — Telephone Encounter (Signed)
Patient Name: Heidi Ellison Gender: Female DOB: 01/05/1991 Age: 30 Y 11 M 9 D Return Phone Number: [phone] (Primary) Address: City/ State/ Zip: [place]  [address] Client [facility], [facility] Provider Bufford Buttner, [place]- PA Contact Type Call Who Is Calling Patient / Member / Family / Caregiver Call Type Triage / Clinical Relationship To Patient Self Return Phone Number [phone] (Primary) Chief Complaint Heart palpitations or irregular heartbeat Reason for Call Symptomatic / Request for Health Information Initial Comment Caller states that her heart rate is high, she has a fever, and Covid. GOTO Facility Not Listed [facility] [employer] [address] or [address] Translation No Nurse Assessment Nurse: Oretha Ellis, Charity fundraiser, Will Date/Time (Eastern Time): 06/28/2021 9:50:26 AM Confirm and document reason for call. If symptomatic, describe symptoms. ---Caller reports positive at home covid test this morning. Slight tickle in throat yesterday. Onset of chills, headache, and fever of 101.0 overnight. Heart rate of 125 per apple watch this morning. Current HR of 114 with palpitations reported. Does the patient have any new or worsening symptoms? ---Yes Will a triage be completed? ---Yes Related visit to physician within the last 2 weeks? ---No Does the PT have any chronic conditions? (i.e. diabetes, asthma, this includes High risk factors for pregnancy, etc.) ---Yes List chronic conditions. ---dm, Is the patient pregnant or possibly pregnant? (Ask all females between the ages of 17-55) ---No Is this a behavioral health or substance abuse call? ---No Guidelines Guideline Title Affirmed Question Affirmed Notes Nurse Date/Time (Eastern Time) COVID-19 - Diagnosed or Suspected MILD difficulty breathing (e.g., minimal/no SOB [place], RN, Will 06/28/2021  9:53:47  Guidelines Guideline Title Affirmed Question Affirmed Notes Nurse Date/Time (Eastern Time) at rest, SOB with walking, pulse <100) Disp. Time Lamount Cohen Time) Disposition Final User 06/28/2021 9:57:48 AM See HCP within 4 Hours (or PCP triage) Yes Oretha Ellis, RN, Will Caller Disagree/Comply Comply Caller Understands Yes PreDisposition Call Doctor Care Advice Given Per Guideline SEE HCP (OR PCP TRIAGE) WITHIN 4 HOURS: * IF OFFICE WILL BE OPEN: You need to be seen within the next 3 or 4 hours. Call your doctor (or NP/PA) now or as soon as the office opens. CALL BACK IF: * You become worse CARE ADVICE given per COVID-19 - DIAGNOSED OR SUSPECTED (Adult) guideline. GENERAL CARE ADVICE FOR COVID-19 SYMPTOMS: * The symptoms are generally treated the same whether you have COVID-19, influenza or some other respiratory virus. * Feeling dehydrated: Drink extra liquids. If the air in your home is dry, use a humidifier. * Cough: Use cough drops. * Fever: For fever over 101 F (38.3 C), take acetaminophen every 4 to 6 hours (Adults 650 mg) OR ibuprofen every 6 to 8 hours (Adults 400 mg). Before taking any medicine, read all the instructions on the package. Do not take aspirin unless your doctor has prescribed it for you. * Muscle aches, headache, and other pains: Often this comes and goes with the fever. Take acetaminophen every 4 to 6 hours (Adults 650 mg) OR ibuprofen every 6 to 8 hours (Adults 400 mg). Before taking any medicine, read all the instructions on the package. * Sore throat: Try throat lozenges, hard candy or warm chicken broth. Comments User: Lamount Cranker, RN Date/Time Lamount Cohen Time): 06/28/2021 10:00:27 AM No appointments available today per office staff member Inetta Fermo. Urgent care recommendation. User: Lamount Cranker, RN Date/Time Lamount Cohen Time): 06/28/2021 10:04:42 AM No appointments available per office staff, caller not  warm transferred due to this reason. Referrals GO TO  FACILITY OTHER - SPECIFY

## 2021-06-29 ENCOUNTER — Encounter: Payer: Self-pay | Admitting: Physician Assistant

## 2021-06-29 ENCOUNTER — Telehealth (INDEPENDENT_AMBULATORY_CARE_PROVIDER_SITE_OTHER): Payer: 59 | Admitting: Physician Assistant

## 2021-06-29 ENCOUNTER — Other Ambulatory Visit: Payer: Self-pay

## 2021-06-29 VITALS — Temp 101.0°F | Ht 60.0 in | Wt 250.0 lb

## 2021-06-29 DIAGNOSIS — U071 COVID-19: Secondary | ICD-10-CM | POA: Diagnosis not present

## 2021-06-29 MED ORDER — NIRMATRELVIR/RITONAVIR (PAXLOVID)TABLET: 3.0000 | ORAL_TABLET | Freq: Two times a day (BID) | ORAL | 0 refills | Status: AC

## 2021-06-29 NOTE — Telephone Encounter (Signed)
See note and advise °

## 2021-06-29 NOTE — Telephone Encounter (Signed)
Patient is scheduled   

## 2021-06-29 NOTE — Progress Notes (Signed)
Virtual Visit via Video Note   I, Heidi Ellison, connected with  Heidi Ellison  ([id number], 02/22/1991) on 06/29/21 at 11:30 AM EST by a video-enabled telemedicine application and verified that I am speaking with the correct person using two identifiers.  Location: Patient: Virtual Visit Location Patient: Home Provider: Virtual Visit Location Provider: Office/Clinic   I discussed the limitations of evaluation and management by telemedicine and the availability of in person appointments. The patient expressed understanding and agreed to proceed.    History of Present Illness: Heidi Ellison is a 30 y.o. who identifies as a female who was assigned female at birth, and is being seen today for COVID-19.  On Wednesday, she had a slight tickle in her throat and then had sudden onset of symptoms worsening on Thursday. She woke up yesterday with fever of 102 and tachycardia.  She has been managing her temperature with Tylenol.  She is eating and drinking okay. When she gets up to walk around she has some shortness of breath and fatigue.  Denies chest pain or lower extremity swelling.  She has had COVID before.  Problems:  Patient Active Problem List   Diagnosis Date Noted   Type 2 diabetes mellitus with hyperglycemia, without long-term current use of insulin ([place]) 05/09/2021   Graves disease 09/20/2020   Hyperthyroidism 07/20/2020   Traumatic arthritis of right ankle    Pain from implanted hardware 09/24/2018   Post-traumatic osteoarthritis of both ankles 08/31/2018   Endometriosis 10/24/2017   Arthritis 08/01/2017   Chronic migraine 04/22/2017   Migraine without status migrainosus, not intractable 01/11/2016    Allergies: No Known Allergies Medications:  Current Outpatient Medications:    ACCU-CHEK GUIDE test strip, USE 1 STRIP TO CHECK GLUCOSE UP TO 4 TIMES DAILY AS DIRECTED, Disp: 100 each, Rfl: 0   Accu-Chek Softclix Lancets lancets, USE 1 LANCET FOR FINGERSTICK TO CHECK  GLUCOSE UP TO 4 TIMES DAILY AS DIRECTED, Disp: 100 each, Rfl: 0   blood glucose meter kit and supplies KIT, Dispense based on patient and insurance preference. Use up to four times daily as directed., Disp: 1 each, Rfl: 0   busPIRone (BUSPAR) 5 MG tablet, Take 1 tablet (5 mg total) by mouth 3 (three) times daily as needed., Disp: 60 tablet, Rfl: 1   hydrOXYzine (ATARAX/VISTARIL) 25 MG tablet, Take 1 tablet (25 mg total) by mouth at bedtime as needed for anxiety (insomnia). Take one 25 mg tablet 30-60 minutes prior to bedtime for insomnia, anxiety. May increase to two tablets., Disp: 60 tablet, Rfl: 0   ibuprofen (ADVIL,MOTRIN) 200 MG tablet, Take 400 mg by mouth every 6 (six) hours as needed for fever or headache (pain). , Disp: , Rfl:    metFORMIN (GLUCOPHAGE) 500 MG tablet, Take 2 tablets (1,000 mg total) by mouth 2 (two) times daily with a meal. Start 500 mg daily x2 weeks, then increase to 1000 mg daily., Disp: 360 tablet, Rfl: 3   methimazole (TAPAZOLE) 5 MG tablet, Take 0.5 tablets (2.5 mg total) by mouth daily., Disp: 45 tablet, Rfl: 1   nirmatrelvir/ritonavir EUA (PAXLOVID) 20 x 150 MG & 10 x 100MG TABS, Take 3 tablets by mouth 2 (two) times daily for 5 days. (Take nirmatrelvir 150 mg two tablets twice daily for 5 days and ritonavir 100 mg one tablet twice daily for 5 days) Patient GFR is WNL, Disp: 30 tablet, Rfl: 0   Semaglutide (RYBELSUS) 7 MG TABS, Take 7 mg by mouth daily., Disp: 90 tablet,  Rfl: 0   SPRINTEC 28 0.25-35 MG-MCG tablet, Take 1 tablet by mouth daily., Disp: , Rfl:   Observations/Objective: Patient is well-developed, well-nourished in no acute distress.  Resting comfortably at home.  Head is normocephalic, atraumatic.  No labored breathing.  Speech is clear and coherent with logical content.  Patient is alert and oriented at baseline.    Assessment and Plan: 1. COVID-19 No red flags on discussion.  Will initiate paxlovid given her health history per orders.  Side  effects and benefits of medication were discussed with patient.  Discussed taking medications as prescribed. Reviewed return precautions including worsening fever, SOB, worsening cough or other concerns. Push fluids and rest.  We reviewed isolation and quarantine guidelines.   Follow Up Instructions: I discussed the assessment and treatment plan with the patient. The patient was provided an opportunity to ask questions and all were answered. The patient agreed with the plan and demonstrated an understanding of the instructions.  A copy of instructions were sent to the patient via MyChart unless otherwise noted below.    The patient was advised to call back or seek an in-person evaluation if the symptoms worsen or if the condition fails to improve as anticipated.   Heidi Ellison, [place]

## 2021-07-09 ENCOUNTER — Other Ambulatory Visit: Payer: Self-pay

## 2021-07-09 ENCOUNTER — Ambulatory Visit: Payer: 59 | Admitting: Physician Assistant
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
## 2021-07-17 ENCOUNTER — Ambulatory Visit: Payer: 59 | Admitting: Dietician

## 2021-07-24 ENCOUNTER — Ambulatory Visit: Payer: 59 | Admitting: Neurology

## 2021-07-24 ENCOUNTER — Encounter: Payer: Self-pay | Admitting: Neurology

## 2021-07-24 VITALS — BP 121/79 | HR 80 | Ht 60.0 in | Wt 246.4 lb

## 2021-07-24 DIAGNOSIS — R351 Nocturia: Secondary | ICD-10-CM

## 2021-07-24 DIAGNOSIS — R0681 Apnea, not elsewhere classified: Secondary | ICD-10-CM

## 2021-07-24 DIAGNOSIS — G4719 Other hypersomnia: Secondary | ICD-10-CM

## 2021-07-24 DIAGNOSIS — R635 Abnormal weight gain: Secondary | ICD-10-CM

## 2021-07-24 DIAGNOSIS — G4733 Obstructive sleep apnea (adult) (pediatric): Secondary | ICD-10-CM | POA: Diagnosis not present

## 2021-07-24 DIAGNOSIS — Z6841 Body Mass Index (BMI) 40.0 and over, adult: Secondary | ICD-10-CM

## 2021-07-24 DIAGNOSIS — R519 Headache, unspecified: Secondary | ICD-10-CM

## 2021-07-24 NOTE — Patient Instructions (Signed)
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
## 2021-08-09 ENCOUNTER — Encounter: Payer: Self-pay | Admitting: Neurology

## 2021-08-13 ENCOUNTER — Ambulatory Visit (INDEPENDENT_AMBULATORY_CARE_PROVIDER_SITE_OTHER): Payer: 59 | Admitting: Physician Assistant

## 2021-08-13 ENCOUNTER — Encounter: Payer: Self-pay | Admitting: Physician Assistant

## 2021-08-13 ENCOUNTER — Other Ambulatory Visit: Payer: Self-pay

## 2021-08-13 VITALS — BP 118/80 | HR 85 | Temp 98.0°F | Ht 60.0 in | Wt 245.0 lb

## 2021-08-13 DIAGNOSIS — E119 Type 2 diabetes mellitus without complications: Secondary | ICD-10-CM

## 2021-08-13 DIAGNOSIS — E1165 Type 2 diabetes mellitus with hyperglycemia: Secondary | ICD-10-CM

## 2021-08-13 LAB — POCT GLYCOSYLATED HEMOGLOBIN (HGB A1C): Hemoglobin A1C: 5.5 % (ref 4.0–5.6)

## 2021-08-13 MED ORDER — RYBELSUS 14 MG PO TABS: 14.0000 mg | ORAL_TABLET | Freq: Every day | ORAL | 1 refills | Status: DC
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
## 2021-08-13 NOTE — Patient Instructions (Signed)
It was great to see you!  Stop metformin  Increase rybelsus to 14 mg  Let's follow-up in 3 months, sooner if you have concerns.  Take care,  Jarold Motto PA-C

## 2021-09-07 ENCOUNTER — Ambulatory Visit: Payer: Medicaid Other | Admitting: Internal Medicine
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
## 2021-09-13 ENCOUNTER — Telehealth: Payer: Self-pay

## 2021-09-13 NOTE — Telephone Encounter (Signed)
We have attempted to contact pt to get new insurance information but she has not responded. If pt calls [place] back we will get her scheduled for her sleep study ?

## 2021-11-12 ENCOUNTER — Ambulatory Visit: Payer: 59 | Admitting: Physician Assistant
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
## 2021-12-10 ENCOUNTER — Encounter: Payer: Self-pay | Admitting: Physician Assistant

## 2021-12-10 NOTE — Telephone Encounter (Signed)
Spoke to pt told her calling about My Chart message, what is your sugar? Pt said last night was 248, this morning 141. She said did not remember what number she said to go to the hospital. Told pt above 500, you were suppose to come back in 3 months for f/u. Pt said she lost her job. Reviewed medications.Told pt to monitor sugars for the week and let us know Thurs or Friday AM what they are. Pt verbalized understanding.

## 2022-02-13 ENCOUNTER — Other Ambulatory Visit: Payer: Self-pay | Admitting: Physician Assistant

## 2022-02-13 ENCOUNTER — Encounter: Payer: Self-pay | Admitting: Physician Assistant

## 2022-02-14 NOTE — Telephone Encounter (Signed)
Informed patient of message below and she verbalized understanding. Patient was informed that since we are out of network, she would still be able to be seen but she would have to pay at least $80 out of pocket per visit and she would be billed for the remainder. Pt verbalized understanding. States she will callback to schedule.

## 2022-02-17 ENCOUNTER — Other Ambulatory Visit: Payer: Self-pay

## 2022-02-17 ENCOUNTER — Encounter (HOSPITAL_BASED_OUTPATIENT_CLINIC_OR_DEPARTMENT_OTHER): Payer: Self-pay

## 2022-02-17 DIAGNOSIS — E119 Type 2 diabetes mellitus without complications: Secondary | ICD-10-CM | POA: Insufficient documentation

## 2022-02-17 DIAGNOSIS — R42 Dizziness and giddiness: Secondary | ICD-10-CM | POA: Diagnosis not present

## 2022-02-17 DIAGNOSIS — R000 Tachycardia, unspecified: Secondary | ICD-10-CM | POA: Diagnosis not present

## 2022-02-17 DIAGNOSIS — R002 Palpitations: Secondary | ICD-10-CM | POA: Insufficient documentation

## 2022-02-17 LAB — CBC
HCT: 42.1 % (ref 36.0–46.0)
Hemoglobin: 14.8 g/dL (ref 12.0–15.0)
MCH: 30.2 pg (ref 26.0–34.0)
MCHC: 35.2 g/dL (ref 30.0–36.0)
MCV: 85.9 fL (ref 80.0–100.0)
Platelets: 316 K/uL (ref 150–400)
RBC: 4.9 MIL/uL (ref 3.87–5.11)
RDW: 13.1 % (ref 11.5–15.5)
WBC: 8.7 K/uL (ref 4.0–10.5)
nRBC: 0 % (ref 0.0–0.2)

## 2022-02-17 LAB — BASIC METABOLIC PANEL
Anion gap: 9 (ref 5–15)
BUN: 12 mg/dL (ref 6–20)
CO2: 24 mmol/L (ref 22–32)
Calcium: 9.8 mg/dL (ref 8.9–10.3)
Chloride: 105 mmol/L (ref 98–111)
Creatinine, Ser: 0.5 mg/dL (ref 0.44–1.00)
GFR, Estimated: >60 mL/min (ref >60)
Glucose, Bld: 165 mg/dL — ABNORMAL HIGH (ref 70–99)
Potassium: 3.7 mmol/L (ref 3.5–5.1)
Sodium: 138 mmol/L (ref 135–145)

## 2022-02-17 NOTE — ED Triage Notes (Signed)
Pt reports 1 week of lightheadedness and dizziness. Pt states that her blood sugar has been fluctuating and when her sugar drops to the 90s, her symptoms worsen.

## 2022-02-18 ENCOUNTER — Emergency Department (HOSPITAL_BASED_OUTPATIENT_CLINIC_OR_DEPARTMENT_OTHER)
Admission: EM | Admit: 2022-02-18 | Discharge: 2022-02-18 | Disposition: A | Discharge status: Home / Self Care | Payer: BLUE CROSS/BLUE SHIELD | Attending: Emergency Medicine | Admitting: Emergency Medicine

## 2022-02-18 DIAGNOSIS — R42 Dizziness and giddiness: Secondary | ICD-10-CM

## 2022-02-18 LAB — URINALYSIS, ROUTINE W REFLEX MICROSCOPIC
Bilirubin Urine: NEGATIVE
Glucose, UA: NEGATIVE mg/dL
Hgb urine dipstick: NEGATIVE
Ketones, ur: NEGATIVE mg/dL
Nitrite: NEGATIVE
Protein, ur: NEGATIVE mg/dL
Specific Gravity, Urine: 1.025 (ref 1.005–1.030)
pH: 5.5 (ref 5.0–8.0)

## 2022-02-18 LAB — URINALYSIS, MICROSCOPIC (REFLEX)

## 2022-02-18 LAB — PREGNANCY, URINE: Preg Test, Ur: NEGATIVE

## 2022-02-18 MED ORDER — SODIUM CHLORIDE 0.9 % IV BOLUS
1000.0000 mL | Freq: Once | INTRAVENOUS | Status: AC
  Administered 2022-02-18: 1000 mL via INTRAVENOUS

## 2022-02-18 NOTE — ED Notes (Signed)
Pt verbalizes understanding of discharge instructions. Opportunity for questioning and answers were provided. Pt discharged from ED to home by self.    

## 2022-02-18 NOTE — ED Provider Notes (Signed)
[facility] Provider Note   CSN: [id number] Arrival date & time: 02/17/22  2132     History  No chief complaint on file.   Heidi Ellison is a 31 y.o. female.  HPI     This is a 31 year old female with a history of diabetes who presents with lightheadedness and dizziness.  Patient reports she has noted progressively worsening lightheadedness and dizziness over the last week.  She states that she will get lightheaded and clammy.  She states "I feel like I might pass out.  She also has some palpitations.  She has been taking her blood sugars when she has symptoms and has noted that her blood sugars have been almost exclusively low 90s when she has symptoms.  She states that normally her blood sugars run 120-200.  She is on Rybelsus.  Denies any recent changes in medications.  She states that primarily her symptoms are early afternoon.  Has not had any recent cough.  No chest pain, shortness of breath, abdominal pain, nausea, vomiting.  She does not believe herself to be pregnant.  Home Medications Prior to Admission medications   Medication Sig Start Date End Date Taking? Authorizing Provider  ACCU-CHEK GUIDE test strip USE 1 STRIP TO CHECK GLUCOSE 4 TIMES DAILY AS DIRECTED 02/14/22   [name], PA  Accu-Chek Softclix Lancets lancets USE 1 LANCET TO CHECK GLUCOSE 4 TIMES DAILY AS DIRECTED 02/14/22   [name], PA  blood glucose meter kit and supplies KIT Dispense based on patient and insurance preference. Use up to four times daily as directed. 03/27/21   [name], PA  busPIRone (BUSPAR) 5 MG tablet Take 1 tablet (5 mg total) by mouth 3 (three) times daily as needed. 03/10/20   [name], PA  hydrOXYzine (ATARAX/VISTARIL) 25 MG tablet Take 1 tablet (25 mg total) by mouth at bedtime as needed for anxiety (insomnia). Take one 25 mg tablet 30-60 minutes prior to bedtime for insomnia, anxiety. May increase to two tablets. 08/25/18    [name], PA  ibuprofen (ADVIL,MOTRIN) 200 MG tablet Take 400 mg by mouth every 6 (six) hours as needed for fever or headache (pain).     [provider]  methimazole (TAPAZOLE) 5 MG tablet Take 0.5 tablets (2.5 mg total) by mouth daily. 02/22/21   Shamleffer, Melanie Crazier, MD  Semaglutide (RYBELSUS) 14 MG TABS Take 1 tablet (14 mg total) by mouth daily. 08/13/21   [name], PA      Allergies    Patient has no known allergies.    Review of Systems   Review of Systems  Constitutional:  Negative for fever.  Neurological:  Positive for light-headedness.  All other systems reviewed and are negative.   Physical Exam Updated Vital Signs BP 125/68   Pulse 86   Temp 98.4 F (36.9 C) (Oral)   Resp 18   Ht 1.524 m (5')   Wt 108.9 kg   LMP  (LMP Unknown)   SpO2 96%   BMI 46.87 kg/m  Physical Exam Vitals and nursing note reviewed.  Constitutional:      Appearance: She is well-developed. She is obese. She is not ill-appearing.  HENT:     Head: Normocephalic and atraumatic.  Eyes:     Pupils: Pupils are equal, round, and reactive to light.  Cardiovascular:     Rate and Rhythm: Regular rhythm. Tachycardia present.     Heart sounds: Normal heart sounds.  Pulmonary:     Effort:  Pulmonary effort is normal. No respiratory distress.     Breath sounds: No wheezing.  Abdominal:     Palpations: Abdomen is soft.     Tenderness: There is no abdominal tenderness.  Musculoskeletal:     Cervical back: Neck supple.  Skin:    General: Skin is warm and dry.  Neurological:     Mental Status: She is alert and oriented to person, place, and time.  Psychiatric:        Mood and Affect: Mood normal.     ED Results / Procedures / Treatments   Labs (all labs ordered are listed, but only abnormal results are displayed) Labs Reviewed  BASIC METABOLIC PANEL - Abnormal; Notable for the following components:      Result Value   Glucose, Bld 165 (*)    All other  components within normal limits  URINALYSIS, ROUTINE W REFLEX MICROSCOPIC - Abnormal; Notable for the following components:   Leukocytes,Ua TRACE (*)    All other components within normal limits  URINALYSIS, MICROSCOPIC (REFLEX) - Abnormal; Notable for the following components:   Bacteria, UA FEW (*)    All other components within normal limits  CBC  PREGNANCY, URINE  CBG MONITORING, ED    EKG EKG Interpretation  Date/Time:  Sunday February 17 2022 21:50:12 EDT Ventricular Rate:  109 PR Interval:  154 QRS Duration: 92 QT Interval:  326 QTC Calculation: 439 R Axis:   68 Text Interpretation: Sinus tachycardia Otherwise normal ECG When compared with ECG of 07-Oct-2018 15:24, PREVIOUS ECG IS PRESENT Confirmed by Regan Lemming ([id number]) on 02/17/2022 10:49:30 PM  Radiology No results found.  Procedures Procedures    Medications Ordered in ED Medications  sodium chloride 0.9 % bolus 1,000 mL (1,000 mLs Intravenous New Bag/Given 02/18/22 0139)    ED Course/ Medical Decision Making/ A&P                           Medical Decision Making Amount and/or Complexity of Data Reviewed Labs: ordered.   This patient presents to the ED for concern of lightheadedness, near syncope, this involves an extensive number of treatment options, and is a complaint that carries with it a high risk of complications and morbidity.  I considered the following differential and admission for this acute, potentially life threatening condition.  The differential diagnosis includes relative hypoglycemia, dehydration, arrhythmia  MDM:    This is a 31 year old female who presents with episodes of near syncope and lightheadedness.  She states these episodes correspond to relative hypoglycemia for her.  No recent changes in her diabetic medication although she is on a glipizide.  Denies any recent infections or pregnancy.  She is overall nontoxic and vital signs initially notable for slightly elevated heart rate at  106.  She denies chest pain or shortness of breath.  Labs obtained and largely reassuring.  No significant metabolic derangements.  EKG without acute arrhythmia or ischemia.  She is not orthostatic.  She was given fluids.  Given her report that symptoms were related to relative hypoglycemia for her, I recommend that she keep a log for her primary physician.  Also recommended frequent small meals to help keep her blood sugar more stable.  Patient stated understanding.  (Labs, imaging, consults)  Labs: I Ordered, and personally interpreted labs.  The pertinent results include: CBC, BMP, urinalysis, urine pregnancy  Imaging Studies ordered: I ordered imaging studies including none I independently visualized and interpreted imaging. I  agree with the radiologist interpretation  Additional history obtained from review.  External records from outside source obtained and reviewed including clinic visit  Cardiac Monitoring: The patient was maintained on a cardiac monitor.  I personally viewed and interpreted the cardiac monitored which showed an underlying rhythm of: Sinus rhythm  Reevaluation: After the interventions noted above, I reevaluated the patient and found that they have :improved  Social Determinants of Health: Lives independently  Disposition: Discharge  Co morbidities that complicate the patient evaluation  Past Medical History:  Diagnosis Date   Anxiety    Arthritis    Chronic migraine 04/22/2017   Diabetes ([place])    Endometriosis 10/24/2017   Graves disease    History of chicken pox    Migraine without status migrainosus, not intractable 01/11/2016   Migraines    Sees Neurology; arthritis in neck and hand   Pain from implanted hardware 09/24/2018   Post-traumatic osteoarthritis of both ankles 08/31/2018   Pre-diabetes    Sleep apnea    Tobacco abuse 10/22/2017   1-2 cigarettes daily, more social   Traumatic arthritis of right ankle      Medicines Meds ordered  this encounter  Medications   sodium chloride 0.9 % bolus 1,000 mL    I have reviewed the patients home medicines and have made adjustments as needed  Problem List / ED Course: Problem List Items Addressed This Visit   None Visit Diagnoses     Light headedness    -  Primary                   Final Clinical Impression(s) / ED Diagnoses Final diagnoses:  Light headedness    Rx / DC Orders ED Discharge Orders     None         Merryl Hacker, MD 02/18/22 [id number]

## 2022-02-18 NOTE — Discharge Instructions (Signed)
You were seen today for dizziness and lightheadedness.  This appears to correspond to low normal blood sugars for you.  Make sure that you are eating small frequent meals.  Follow with your primary physician regarding medications for your diabetes.

## 2022-02-19 ENCOUNTER — Encounter: Payer: Self-pay | Admitting: Physician Assistant

## 2022-02-19 ENCOUNTER — Ambulatory Visit: Payer: BLUE CROSS/BLUE SHIELD | Admitting: Physician Assistant

## 2022-02-19 VITALS — BP 116/78 | HR 86 | Temp 98.4°F | Ht 60.0 in | Wt 245.0 lb

## 2022-02-19 DIAGNOSIS — R42 Dizziness and giddiness: Secondary | ICD-10-CM

## 2022-02-19 DIAGNOSIS — E1165 Type 2 diabetes mellitus with hyperglycemia: Secondary | ICD-10-CM | POA: Diagnosis not present

## 2022-02-19 DIAGNOSIS — F419 Anxiety disorder, unspecified: Secondary | ICD-10-CM

## 2022-02-19 DIAGNOSIS — N926 Irregular menstruation, unspecified: Secondary | ICD-10-CM

## 2022-02-19 DIAGNOSIS — E0590 Thyrotoxicosis, unspecified without thyrotoxic crisis or storm: Secondary | ICD-10-CM

## 2022-02-19 LAB — POCT GLYCOSYLATED HEMOGLOBIN (HGB A1C): Hemoglobin A1C: 6.6 % — AB (ref 4.0–5.6)

## 2022-02-19 MED ORDER — RYBELSUS 14 MG PO TABS: 14.0000 mg | ORAL_TABLET | Freq: Every day | ORAL | 1 refills | Status: DC

## 2022-02-19 MED ORDER — FREESTYLE LIBRE 2 SENSOR MISC: 0 refills | Status: AC

## 2022-02-19 NOTE — Patient Instructions (Signed)
It was great to see you!  Complete the free style Heidi Ellison free trial and let me know how this goes  We will update your thyroid and liver labs  Stay hydrated and eat small, frequent meals  Continue to work on anxiety management  Please reach out to Dr. Lonzo Cloud (endo) and your ob-gyn  Follow-up in 1-2 months for physical  Take care,  Jarold Motto PA-C
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
## 2022-02-20 ENCOUNTER — Encounter: Payer: Self-pay | Admitting: Physician Assistant

## 2022-02-20 ENCOUNTER — Other Ambulatory Visit: Payer: Self-pay | Admitting: Physician Assistant

## 2022-02-20 LAB — COMPREHENSIVE METABOLIC PANEL
ALT: 51 U/L — ABNORMAL HIGH (ref 0–35)
AST: 33 U/L (ref 0–37)
Albumin: 4.4 g/dL (ref 3.5–5.2)
Alkaline Phosphatase: 65 U/L (ref 39–117)
BUN: 11 mg/dL (ref 6–23)
CO2: 27 mEq/L (ref 19–32)
Calcium: 9.9 mg/dL (ref 8.4–10.5)
Chloride: 100 mEq/L (ref 96–112)
Creatinine, Ser: 0.62 mg/dL (ref 0.40–1.20)
GFR: 116.87 mL/min (ref >60.00)
Glucose, Bld: 113 mg/dL — ABNORMAL HIGH (ref 70–99)
Potassium: 4.3 mEq/L (ref 3.5–5.1)
Sodium: 137 mEq/L (ref 135–145)
Total Bilirubin: 0.4 mg/dL (ref 0.2–1.2)
Total Protein: 7.6 g/dL (ref 6.0–8.3)

## 2022-02-20 LAB — TSH: TSH: 0 u[IU]/mL — ABNORMAL LOW (ref 0.35–5.50)

## 2022-02-20 LAB — T4, FREE: Free T4: 1.95 ng/dL — ABNORMAL HIGH (ref 0.60–1.60)

## 2022-02-20 MED ORDER — METHIMAZOLE 5 MG PO TABS: 5.0000 mg | ORAL_TABLET | Freq: Every day | ORAL | 1 refills | Status: DC

## 2022-02-22 NOTE — Telephone Encounter (Signed)
Received fax from [employer] prior auth form needed to fill out for Rybelsus. Form filled out and faxed back to [employer] at [phone]. Awaiting determination.

## 2022-02-25 NOTE — Telephone Encounter (Signed)
Please see message and advise 

## 2022-02-25 NOTE — Telephone Encounter (Signed)
Dr. Ruthine Dose, see pt's response.

## 2022-02-25 NOTE — Telephone Encounter (Signed)
Spoke to pt told her I had Dr. Ruthine Dose review her message due to [name] is out of the office. Told her Dr. Ruthine Dose said, the thyroid may be contributing.  Has she changed her diet?  Pt said she is eating more healthy and trying to do what [name] told her to do eat in between meals. Looks like needs to eat a protein snack before bed.  What did she eat/do when it dropped to 54? Pt said she had eggs and coffee at 8:00 am took a shower and then had a meeting and got an alert on her watch that sugar dropped, checked at 10:48 and it was 54. Asked her what she ate to bring it up? Pt said she ate some strawberries and drank some water and it went up to 83. Told pt is was probably because you did not have a snack at 10:00. Asked her if she has any peanut butter? Pt said yes. Told her you can have some peanut butter on crackers. Do you have any juice? Pt said no, she is going to go to the store. Told her need to eat a good lunch with protein and carb and recheck sugar an how after you eat and let me know what it is, just send me a My Chart message. Pt verbalized understanding. Told her I will let Dr. Ruthine Dose know and she said we are going to hold off on Metformin for now. Pt verbalized understanding.

## 2022-02-26 ENCOUNTER — Emergency Department (HOSPITAL_BASED_OUTPATIENT_CLINIC_OR_DEPARTMENT_OTHER)
Admission: EM | Admit: 2022-02-26 | Discharge: 2022-02-26 | Disposition: A | Discharge status: Home / Self Care | Payer: BLUE CROSS/BLUE SHIELD | Attending: Emergency Medicine | Admitting: Emergency Medicine

## 2022-02-26 ENCOUNTER — Other Ambulatory Visit: Payer: Self-pay

## 2022-02-26 ENCOUNTER — Emergency Department (HOSPITAL_BASED_OUTPATIENT_CLINIC_OR_DEPARTMENT_OTHER): Payer: BLUE CROSS/BLUE SHIELD

## 2022-02-26 ENCOUNTER — Other Ambulatory Visit (HOSPITAL_BASED_OUTPATIENT_CLINIC_OR_DEPARTMENT_OTHER): Payer: Self-pay

## 2022-02-26 ENCOUNTER — Encounter (HOSPITAL_BASED_OUTPATIENT_CLINIC_OR_DEPARTMENT_OTHER): Payer: Self-pay

## 2022-02-26 DIAGNOSIS — T85614A Breakdown (mechanical) of insulin pump, initial encounter: Secondary | ICD-10-CM | POA: Diagnosis not present

## 2022-02-26 DIAGNOSIS — R1030 Lower abdominal pain, unspecified: Secondary | ICD-10-CM | POA: Insufficient documentation

## 2022-02-26 DIAGNOSIS — E11649 Type 2 diabetes mellitus with hypoglycemia without coma: Secondary | ICD-10-CM | POA: Insufficient documentation

## 2022-02-26 DIAGNOSIS — R531 Weakness: Secondary | ICD-10-CM | POA: Insufficient documentation

## 2022-02-26 DIAGNOSIS — T383X5A Adverse effect of insulin and oral hypoglycemic [antidiabetic] drugs, initial encounter: Secondary | ICD-10-CM | POA: Diagnosis not present

## 2022-02-26 DIAGNOSIS — Y828 Other medical devices associated with adverse incidents: Secondary | ICD-10-CM | POA: Insufficient documentation

## 2022-02-26 DIAGNOSIS — Z7984 Long term (current) use of oral hypoglycemic drugs: Secondary | ICD-10-CM | POA: Diagnosis not present

## 2022-02-26 DIAGNOSIS — Z20822 Contact with and (suspected) exposure to covid-19: Secondary | ICD-10-CM | POA: Diagnosis not present

## 2022-02-26 DIAGNOSIS — R42 Dizziness and giddiness: Secondary | ICD-10-CM | POA: Diagnosis present

## 2022-02-26 DIAGNOSIS — T85698A Other mechanical complication of other specified internal prosthetic devices, implants and grafts, initial encounter: Secondary | ICD-10-CM

## 2022-02-26 LAB — URINALYSIS, ROUTINE W REFLEX MICROSCOPIC
Bilirubin Urine: NEGATIVE
Glucose, UA: NEGATIVE mg/dL
Ketones, ur: NEGATIVE mg/dL
Nitrite: NEGATIVE
RBC / HPF: >50 RBC/hpf — ABNORMAL HIGH (ref 0–5)
Specific Gravity, Urine: 1.008 (ref 1.005–1.030)
pH: 7.5 (ref 5.0–8.0)

## 2022-02-26 LAB — CBC
HCT: 40.7 % (ref 36.0–46.0)
Hemoglobin: 14 g/dL (ref 12.0–15.0)
MCH: 29.7 pg (ref 26.0–34.0)
MCHC: 34.4 g/dL (ref 30.0–36.0)
MCV: 86.4 fL (ref 80.0–100.0)
Platelets: 284 10*3/uL (ref 150–400)
RBC: 4.71 MIL/uL (ref 3.87–5.11)
RDW: 12.6 % (ref 11.5–15.5)
WBC: 6.8 10*3/uL (ref 4.0–10.5)
nRBC: 0 % (ref 0.0–0.2)

## 2022-02-26 LAB — BASIC METABOLIC PANEL
Anion gap: 12 (ref 5–15)
BUN: 7 mg/dL (ref 6–20)
CO2: 22 mmol/L (ref 22–32)
Calcium: 9.7 mg/dL (ref 8.9–10.3)
Chloride: 105 mmol/L (ref 98–111)
Creatinine, Ser: 0.38 mg/dL — ABNORMAL LOW (ref 0.44–1.00)
GFR, Estimated: >60 mL/min (ref >60)
Glucose, Bld: 121 mg/dL — ABNORMAL HIGH (ref 70–99)
Potassium: 4.3 mmol/L (ref 3.5–5.1)
Sodium: 139 mmol/L (ref 135–145)

## 2022-02-26 LAB — RESP PANEL BY RT-PCR (FLU A&B, COVID) ARPGX2
Influenza A by PCR: NEGATIVE
Influenza B by PCR: NEGATIVE
SARS Coronavirus 2 by RT PCR: NEGATIVE

## 2022-02-26 LAB — CBG MONITORING, ED
Glucose-Capillary: 110 mg/dL — ABNORMAL HIGH (ref 70–99)
Glucose-Capillary: 113 mg/dL — ABNORMAL HIGH (ref 70–99)
Glucose-Capillary: 88 mg/dL (ref 70–99)

## 2022-02-26 LAB — PREGNANCY, URINE: Preg Test, Ur: NEGATIVE

## 2022-02-26 NOTE — Telephone Encounter (Signed)
Spoke to pt told her calling to make sure you are going to the ED. Pt said she is headed there now. Told her okay.

## 2022-02-26 NOTE — ED Notes (Signed)
Patient given snack.  

## 2022-02-26 NOTE — ED Notes (Signed)
Patient Provided with Apple Juice for BG Maintenance while Patient waits for Examination Room Availability.

## 2022-02-26 NOTE — ED Notes (Signed)
Patient given discharge instructions. Questions were answered. Patient verbalized understanding of discharge instructions and care at home.  

## 2022-02-26 NOTE — Discharge Instructions (Addendum)
Your freestyle [name] is defunct and likely needs to be replaced as you have had multiple blood sugars which are normal despite your freestyle libre reading in the 50s.  Recommend you follow-up outpatient for replacement of the device.  Had hematuria on her urinalysis without clear evidence of urinary tract infection.  Your CT scan revealed no evidence of nephrolithiasis.

## 2022-02-26 NOTE — ED Notes (Signed)
Pt's CBG result was 110. Informed Lil - Triage RN.

## 2022-02-26 NOTE — ED Triage Notes (Signed)
Patient here POV from Home.  Endorses History of T2DM and has been having Difficulty maintaining her BG Level in the Past Week. Seen here 1 Week ago for Same. Seen by PCP Today and Sent for Evaluation.   Some Lightheadedness and Weakness.  BG Level in the 50s per her Monitoring System.   NAD Noted during Triage. A&Ox4. GCS 15. Ambulatory.

## 2022-02-26 NOTE — ED Provider Notes (Signed)
[facility] EMERGENCY DEPT Provider Note   CSN: [id number] Arrival date & time: 02/26/22  1104     History  Chief Complaint  Patient presents with   Hypoglycemia    Heidi Ellison is a 31 y.o. female.   Hypoglycemia    31 year old female with medical history significant for diabetes mellitus type 2 who presents to the emergency department with difficulty maintaining her blood glucose level over the past week.  She was seen 1 week ago for a similar complaint.  She saw her PCP today and was sent for evaluation.  She endorses some generalized weakness and lightheadedness.  Her blood glucose level was notably in the 50s all throughout the night and day per her monitoring system.  Her blood sugars normally run between 120 and 200.  She is on Rybelsus.  She denies any recent changes in her medications.  She denies any infectious symptoms.  Home Medications Prior to Admission medications   Medication Sig Start Date End Date Taking? Authorizing Provider  ACCU-CHEK GUIDE test strip USE 1 STRIP TO CHECK GLUCOSE 4 TIMES DAILY AS DIRECTED 02/14/22  Yes Morene Rankins, [place], PA  Accu-Chek Softclix Lancets lancets USE 1 LANCET TO CHECK GLUCOSE 4 TIMES DAILY AS DIRECTED 02/14/22  Yes [name], PA  blood glucose meter kit and supplies KIT Dispense based on patient and insurance preference. Use up to four times daily as directed. 03/27/21  Yes [name], PA  Continuous Blood Gluc Sensor (FREESTYLE LIBRE 2 SENSOR) MISC Apply to skin for 14 days 02/19/22  Yes Morene Rankins, [place], PA  medroxyPROGESTERone (PROVERA) 10 MG tablet Take 10 mg by mouth daily. 02/08/22  Yes [provider]  methimazole (TAPAZOLE) 5 MG tablet Take 1 tablet (5 mg total) by mouth daily. 02/20/22  Yes [name], PA  busPIRone (BUSPAR) 5 MG tablet Take 1 tablet (5 mg total) by mouth 3 (three) times daily as needed. 03/10/20   [name], PA  hydrOXYzine (ATARAX/VISTARIL) 25 MG tablet Take 1  tablet (25 mg total) by mouth at bedtime as needed for anxiety (insomnia). Take one 25 mg tablet 30-60 minutes prior to bedtime for insomnia, anxiety. May increase to two tablets. 08/25/18   [name], PA  ibuprofen (ADVIL,MOTRIN) 200 MG tablet Take 400 mg by mouth every 6 (six) hours as needed for fever or headache (pain).     [provider]  Semaglutide (RYBELSUS) 14 MG TABS Take 1 tablet (14 mg total) by mouth daily. 02/19/22   [name], PA      Allergies    Patient has no known allergies.    Review of Systems   Review of Systems  All other systems reviewed and are negative.   Physical Exam Updated Vital Signs BP (!) 141/65   Pulse 69   Temp 98 F (36.7 C)   Resp 12   Ht 5' (1.524 m)   Wt 111.1 kg   LMP  (LMP Unknown)   SpO2 95%   BMI 47.83 kg/m  Physical Exam Vitals and nursing note reviewed.  Constitutional:      General: She is not in acute distress.    Appearance: She is well-developed.  HENT:     Head: Normocephalic and atraumatic.  Eyes:     Conjunctiva/sclera: Conjunctivae normal.  Cardiovascular:     Rate and Rhythm: Normal rate and regular rhythm.  Pulmonary:     Effort: Pulmonary effort is normal. No respiratory distress.     Breath sounds: Normal breath sounds.  Abdominal:     Palpations: Abdomen is soft.     Tenderness: There is no abdominal tenderness.  Musculoskeletal:        General: No swelling.     Cervical back: Neck supple.  Skin:    General: Skin is warm and dry.     Capillary Refill: Capillary refill takes less than 2 seconds.  Neurological:     General: No focal deficit present.     Mental Status: She is alert and oriented to person, place, and time. Mental status is at baseline.  Psychiatric:        Mood and Affect: Mood normal.     ED Results / Procedures / Treatments   Labs (all labs ordered are listed, but only abnormal results are displayed) Labs Reviewed  BASIC METABOLIC PANEL - Abnormal; Notable for  the following components:      Result Value   Glucose, Bld 121 (*)    Creatinine, Ser 0.38 (*)    All other components within normal limits  URINALYSIS, ROUTINE W REFLEX MICROSCOPIC - Abnormal; Notable for the following components:   Color, Urine ORANGE (*)    APPearance HAZY (*)    Hgb urine dipstick LARGE (*)    Protein, ur TRACE (*)    Leukocytes,Ua SMALL (*)    RBC / HPF >50 (*)    All other components within normal limits  CBG MONITORING, ED - Abnormal; Notable for the following components:   Glucose-Capillary 110 (*)    All other components within normal limits  CBG MONITORING, ED - Abnormal; Notable for the following components:   Glucose-Capillary 113 (*)    All other components within normal limits  RESP PANEL BY RT-PCR (FLU A&B, COVID) ARPGX2  CBC  PREGNANCY, URINE  CBG MONITORING, ED    EKG None  Radiology CT Renal Stone Study  Result Date: 02/26/2022 CLINICAL DATA:  Lower abdominal pain EXAM: CT ABDOMEN AND PELVIS WITHOUT CONTRAST TECHNIQUE: Multidetector CT imaging of the abdomen and pelvis was performed following the standard protocol without IV contrast. RADIATION DOSE REDUCTION: This exam was performed according to the departmental dose-optimization program which includes automated exposure control, adjustment of the mA and/or kV according to patient size and/or use of iterative reconstruction technique. COMPARISON:  11/25/2017 FINDINGS: Lower chest: No pleural or pericardial effusion. Visualized lung bases clear. Hepatobiliary: No focal liver abnormality is seen. No gallstones, gallbladder wall thickening, or biliary dilatation. Pancreas: Unremarkable. No pancreatic ductal dilatation or surrounding inflammatory changes. Spleen: Normal in size without focal abnormality. Adrenals/Urinary Tract: No adrenal mass. No urolithiasis. No hydronephrosis. Symmetric bilateral renal contours. Urinary bladder partially distended, unremarkable. Stomach/Bowel: Stomach partially  distended. Small bowel decompressed. Normal appendix. Colon incompletely distended. No acute findings. Vascular/Lymphatic: No significant vascular findings are present. No enlarged abdominal or pelvic lymph nodes. Reproductive: Uterus and bilateral adnexa are unremarkable. Other: No ascites.  No free air. Musculoskeletal: No acute or significant osseous findings. IMPRESSION: No acute findings. Electronically Signed   By: Lucrezia Europe M.D.   On: 02/26/2022 15:06    Procedures Procedures    Medications Ordered in ED Medications - No data to display  ED Course/ Medical Decision Making/ A&P Clinical Course as of 02/26/22 1911  Tue Feb 26, 2022  1535 Hgb urine dipstick(!): LARGE [JL]  1535 Chalmers Guest): SMALL [JL]  1535 WBC, UA: 0-5 [JL]    Clinical Course User Index [JL] Regan Lemming, MD  Medical Decision Making Amount and/or Complexity of Data Reviewed Labs: ordered. Decision-making details documented in ED Course. Radiology: ordered.   31 year old female with medical history significant for diabetes mellitus type 2 who presents to the emergency department with difficulty maintaining her blood glucose level over the past week.  She was seen 1 week ago for a similar complaint.  She saw her PCP today and was sent for evaluation.  She endorses some generalized weakness and lightheadedness.  Her blood glucose level was notably in the 50s all throughout the night and day per her monitoring system.  Her blood sugars normally run between 120 and 200.  She is on Rybelsus.  She denies any recent changes in her medications.  She denies any infectious symptoms.  On arrival, the patient was vitally stable.  Sinus rhythm noted on cardiac telemetry.  Physical exam generally unremarkable.  Patient presenting with symptoms concerning for hypoglycemia in the setting of her freestyle libre continuous glucometer reading blood glucose is in the in the 50s.  This is despite eating  meals.  She states that her blood glucose is normally managed in a more normal range to slightly elevated.  Differential diagnosis includes hypoglycemia, electrolyte abnormality, infectious etiology.  Patient had urinalysis with large hemoglobin.  She is unsure if she is starting her menstrual cycle.  She gets irregular menstrual cycles.  Laboratory work-up additionally significant for COVID-19 influenza PCR testing negative, CBC without a leukocytosis or anemia, BMP without electrolyte abnormality, normal renal function.  Given the patient's hematuria, uncertainty of her menstrual cycle, considered nephrolithiasis.  The patient endorses some right-sided flank discomfort.  CT renal stone study was ordered and resulted negative for acute abnormalities.  Notably, the patient's glucoses were monitored over the course of 4 hours and 30 minutes in the emergency department.  Her glucoses were continuously checked and found to be normal or elevated slightly above normal.  Her continuous freestyle libre glucometer continue to read blood glucose is in the 50s despite normal fingersticks.  I suspect either an error in calibration or a malfunction of her continuous glucometer as the etiology of her presentation.  Patient is tolerating oral intake at this time.  Her most recent blood glucose read 88 and normal.  She has had no episodes of hypoglycemia here in the emergency department despite having continuous, her readings that are persistently low.  I advised the patient to ignore her continuous glucometer and to revert back to fingerstick glucose measurements until she can get her glucometer either recalibrated or replaced.  Advised PCP follow-up.  Patient endorsed understanding.  Stable for discharge.   Final Clinical Impression(s) / ED Diagnoses Final diagnoses:  Malfunction of device, initial encounter    Rx / DC Orders ED Discharge Orders     None         Regan Lemming, MD 02/26/22 1911

## 2022-02-27 ENCOUNTER — Ambulatory Visit (INDEPENDENT_AMBULATORY_CARE_PROVIDER_SITE_OTHER): Payer: BLUE CROSS/BLUE SHIELD | Admitting: Internal Medicine

## 2022-02-27 ENCOUNTER — Encounter: Payer: Self-pay | Admitting: Internal Medicine

## 2022-02-27 ENCOUNTER — Telehealth: Payer: Self-pay | Admitting: *Deleted

## 2022-02-27 VITALS — BP 118/74 | HR 76 | Temp 97.9°F | Resp 14 | Ht 60.0 in | Wt 245.6 lb

## 2022-02-27 DIAGNOSIS — E162 Hypoglycemia, unspecified: Secondary | ICD-10-CM

## 2022-02-27 MED ORDER — GLUCAGON (RDNA) 1 MG IJ KIT: PACK | INTRAMUSCULAR | 3 refills | Status: DC

## 2022-02-27 MED ORDER — GLUCOSE 40 % PO GEL: 1.0000 | Freq: Once | ORAL | Status: AC | PRN

## 2022-02-27 MED ORDER — BLOOD GLUCOSE MONITOR KIT: PACK | 0 refills | Status: AC

## 2022-02-27 NOTE — Progress Notes (Signed)
Routine Medical Office Visit  Patient:  Heidi Ellison      Age: 31 y.o.       Sex:  female  Date:   02/27/2022  PCP:    [name], [place] Provider: Loralee Pacas, MD   Assessment/Plan:   Idiopathic hypoglycemia   She has been having some marked low sugars with symptoms very frequently multiple times per day for the last week or 2 despite recent increase in her methimazole for recent findings of hyperthyroidism.  This is confirmed on the [place] but her home sugar reader cannot confirm it.  She is not on any sugar medications at all.  An ER visit did not find a low sugar until she was about to leave. She was found to have 0-5 wbc. They found blood in urine and she is menstruating since she stopped provera.  Planned to see shamleffer in about 5days for endocrine- they dont know about the low sugars yet.     I reviewed Sam Worley's note and ER note and labs  Will check some labs but I suspect the problem is a methimazole side effect and increasing the dose is making it worse.  I will rule out insulinoma and addisons with labs, gave her info on glycemic index and dextrose glucagon for meantime and she has [place] 2to alert her on lows.    She was advised to call the office or go to ER if her condition worsens     Subjective:   Heidi Ellison is a 31 y.o. female with PMH significant for: Past Medical History:  Diagnosis Date   Anxiety    Arthritis    Chronic migraine 04/22/2017   Diabetes ([place])    Endometriosis 10/24/2017   Graves disease    History of chicken pox    Migraine without status migrainosus, not intractable 01/11/2016   Migraines    Sees Neurology; arthritis in neck and hand   Pain from implanted hardware 09/24/2018   Post-traumatic osteoarthritis of both ankles 08/31/2018   Pre-diabetes    Sleep apnea    Tobacco abuse 10/22/2017   1-2 cigarettes daily, more social   Traumatic arthritis of right ankle      Presenting today  with: Chief Complaint  Patient presents with   Follow-up    Sugar levels have been up and down. Elenor Legato was reading low yesterday so she went to the ER per Donna's advice (our nurse). Readings were 110,113 and 88 a couple of hours apart yesterday while there. They think that the Elenor Legato may have a defect and that the readings are normal. Accu check monitor has not been working properly.     She was told the current libre 2 sensor is scanning lower than fingersticks here and hospital.  She does think sugar is going low though bc she gets shaky jittery, nausea, lightheaded, sweaty.  Typically when sugar high, gets thirsty and nausea.   Hasnt checked libre against the reads at home bc home machine is Joretta Bachelor, niece dropped it.     Last week sam tried to go 7->14 on rybelsus.   She was out of rybelsus and has none at this time. The weird part is she is off all sugar meds and getting low sugars anyway.  Thyroid was hyperactive so last week endocrinologist increased methimazole from 0.5->1 tablet daily. She made that change 3 days. Unfortunately, sugars continue lows for last 3 days despite the increased methimazole.   Patternis  worse sugars at night.  She is eating eggs and bacon or toast or cereal in am. Salad for lunch.  Limited dinner with crackers and juice.  She has had er visit.  The hypoglycemic events      [email or web address] was fed the data and fed back the following: Comprehensive Review of the Case: The patient is a 31 year old female with a history of hyperthyroidism and diabetes. She has been experiencing frequent episodes of low blood sugar for the past two weeks, despite an increase in her methimazole dosage for hyperthyroidism. The low blood sugar episodes are confirmed on her [place] device, but not on her home sugar reader. She is not currently on any diabetes medications. She recently visited the ER, where her blood sugar was found to be low just before she was discharged. Her white blood cell count  was found to be 0-5, and blood was found in her urine. She is currently menstruating since she stopped taking Provera. Her fT4 was 1.9 last week before her methimazole was increased. A CBC, BMP, and viral panel conducted three days ago were all normal, and a pregnancy test was negative. She has a history of diabetes with an A1C of 6.6, but she is currently off all diabetes medications. Her blood sugars have been dipping into the 50s, and she has been experiencing symptoms such as fatigue, weakness, and tiredness. The low blood sugar problem started about two weeks ago and seems to be getting worse.  Most Likely Dx:   a. Methimazole-induced hypoglycemia: Methimazole, a medication used to treat hyperthyroidism, can sometimes cause hypoglycemia as a side effect. This could explain the patient's frequent episodes of low blood sugar despite not being on any diabetes medications. The presence of other side effects of methimazole, such as nausea, vomiting, or rash, could further suggest this diagnosis.  Expanded DDx:  b. Insulinoma: An insulinoma is a rare tumor of the pancreas that produces excessive amounts of insulin, leading to hypoglycemia. This could explain the patient's frequent episodes of low blood sugar despite not being on any diabetes medications. The presence of a mass on imaging of the pancreas could further suggest this diagnosis.  c. Addison's disease: Addison's disease, or primary adrenal insufficiency, can cause hypoglycemia due to a lack of cortisol, which helps to regulate blood sugar levels. This could explain the patient's frequent episodes of low blood sugar. The presence of other symptoms of Addison's disease, such as hyperpigmentation, low blood pressure, or salt craving, could further suggest this diagnosis.  Alternative DDx:  d. Hypopituitarism: Hypopituitarism, or decreased secretion of pituitary hormones, can cause hypoglycemia due to a lack of growth hormone and ACTH, both of  which help to regulate blood sugar levels. The presence of other symptoms of hypopituitarism, such as fatigue, weight loss, or decreased libido, could further suggest this diagnosis.  e. Severe liver disease: Severe liver disease can cause hypoglycemia due to the liver's role in glucose metabolism. The presence of other signs of liver disease, such as jaundice, ascites, or spider angiomas, could further suggest this diagnosis.  f. Rare genetic metabolic disorders: Certain rare genetic metabolic disorders, such as glycogen storage diseases or disorders of fatty acid oxidation, can cause hypoglycemia. The presence of other symptoms of these disorders, such as hepatomegaly or muscle weakness, could further suggest this diagnosis.  g. Autoimmune hypoglycemia: Autoimmune hypoglycemia, or insulin autoimmune syndrome, is a rare cause of hypoglycemia where antibodies against insulin or the insulin receptor are produced. The presence of these antibodies in the  blood could further suggest this diagnosis.  h. Sepsis: Sepsis can cause hypoglycemia due to increased glucose consumption by the body and impaired glucose production by the liver. The presence of other signs of sepsis, such as fever, tachycardia, or altered mental status, could further suggest this diagnosis.  i. Renal failure: Renal failure can cause hypoglycemia due to decreased renal clearance of insulin and other anti-diabetic medications. The presence of other signs of renal failure, such as edema, hypertension, or changes in urine output, could further suggest this diagnosis.  j. Alcohol abuse: Alcohol abuse can cause hypoglycemia due to its effects on glucose metabolism. The presence of other signs of alcohol abuse, such as liver disease, neuropathy, or psychiatric symptoms, could further suggest this diagnosis.      Objective:  Physical Exam: BP 118/74 (BP Location: Right Arm, Patient Position: Sitting)   Pulse 76   Temp 97.9 F (36.6 C)  (Temporal)   Resp 14   Ht 5' (1.524 m)   Wt 245 lb 9.6 oz (111.4 kg)   LMP  (LMP Unknown)   SpO2 95%   BMI 47.97 kg/m    Gen: No acute distress, resting comfortably Psych: Normal affect and thought content  Problem specific physical exam findings:  Overweight, no purple striae.  Easy brusiing noted  No images are attached to the encounter or orders placed in the encounter.    Results: No results found for any visits on 02/27/22.   Recent Results (from the past 2160 hour(s))  Basic metabolic panel     Status: Abnormal   Collection Time: 02/17/22  9:41 PM  Result Value Ref Range   Sodium 138 135 - 145 mmol/L   Potassium 3.7 3.5 - 5.1 mmol/L   Chloride 105 98 - 111 mmol/L   CO2 24 22 - 32 mmol/L   Glucose, Bld 165 (H) 70 - 99 mg/dL    Comment: Glucose reference range applies only to samples taken after fasting for at least 8 hours.   BUN 12 6 - 20 mg/dL   Creatinine, Ser 0.50 0.44 - 1.00 mg/dL   Calcium 9.8 8.9 - 10.3 mg/dL   GFR, Estimated >60 >60 mL/min    Comment: (NOTE) Calculated using the CKD-EPI Creatinine Equation (2021)    Anion gap 9 5 - 15    Comment: Performed at [facility], [address] [place], [place] [address]  CBC     Status: None   Collection Time: 02/17/22  9:41 PM  Result Value Ref Range   WBC 8.7 4.0 - 10.5 K/uL   RBC 4.90 3.87 - 5.11 MIL/uL   Hemoglobin 14.8 12.0 - 15.0 g/dL   HCT 42.1 36.0 - 46.0 %   MCV 85.9 80.0 - 100.0 fL   MCH 30.2 26.0 - 34.0 pg   MCHC 35.2 30.0 - 36.0 g/dL   RDW 13.1 11.5 - 15.5 %   Platelets 316 150 - 400 K/uL   nRBC 0.0 0.0 - 0.2 %    Comment: Performed at [facility], [place], [place] [address]  Urinalysis, Routine w reflex microscopic     Status: Abnormal   Collection Time: 02/17/22  9:41 PM  Result Value Ref Range   Color, Urine YELLOW YELLOW   APPearance CLEAR CLEAR   Specific Gravity, Urine 1.025 1.005 - 1.030   pH 5.5 5.0 - 8.0   Glucose, UA  NEGATIVE NEGATIVE mg/dL   Hgb urine dipstick NEGATIVE NEGATIVE   Bilirubin Urine NEGATIVE NEGATIVE  Ketones, ur NEGATIVE NEGATIVE mg/dL   Protein, ur NEGATIVE NEGATIVE mg/dL   Nitrite NEGATIVE NEGATIVE   Leukocytes,Ua TRACE (A) NEGATIVE    Comment: Performed at [facility], [address] [place], [place] [address]  Pregnancy, urine     Status: None   Collection Time: 02/17/22  9:41 PM  Result Value Ref Range   Preg Test, Ur NEGATIVE NEGATIVE    Comment:        THE SENSITIVITY OF THIS METHODOLOGY IS >20 mIU/mL. Performed at [facility], [address] [place], [place] [address]   Urinalysis, Microscopic (reflex)     Status: Abnormal   Collection Time: 02/17/22  9:41 PM  Result Value Ref Range   RBC / HPF 0-5 0 - 5 RBC/hpf   WBC, UA 6-10 0 - 5 WBC/hpf   Bacteria, UA FEW (A) NONE SEEN   Squamous Epithelial / LPF 11-20 0 - 5    Comment: Performed at [facility], [address], [place], [place] [address]  POCT HgB A1C     Status: Abnormal   Collection Time: 02/19/22  2:35 PM  Result Value Ref Range   Hemoglobin A1C 6.6 (A) 4.0 - 5.6 %  TSH     Status: Abnormal   Collection Time: 02/19/22  2:48 PM  Result Value Ref Range   TSH 0.00 Repeated and verified X2. (L) 0.35 - 5.50 uIU/mL  T4, free     Status: Abnormal   Collection Time: 02/19/22  2:48 PM  Result Value Ref Range   Free T4 1.95 (H) 0.60 - 1.60 ng/dL    Comment: Specimens from patients who are undergoing biotin therapy and /or ingesting biotin supplements may contain high levels of biotin.  The higher biotin concentration in these specimens interferes with this Free T4 assay.  Specimens that contain high levels  of biotin may cause false high results for this Free T4 assay.  Please interpret results in light of the total clinical presentation of the patient.    Comp Met (CMET)     Status: Abnormal   Collection Time: 02/19/22  2:48 PM  Result Value Ref Range    Sodium 137 135 - 145 mEq/L   Potassium 4.3 3.5 - 5.1 mEq/L   Chloride 100 96 - 112 mEq/L   CO2 27 19 - 32 mEq/L   Glucose, Bld 113 (H) 70 - 99 mg/dL   BUN 11 6 - 23 mg/dL   Creatinine, Ser 0.62 0.40 - 1.20 mg/dL   Total Bilirubin 0.4 0.2 - 1.2 mg/dL   Alkaline Phosphatase 65 39 - 117 U/L   AST 33 0 - 37 U/L   ALT 51 (H) 0 - 35 U/L   Total Protein 7.6 6.0 - 8.3 g/dL   Albumin 4.4 3.5 - 5.2 g/dL   GFR 116.87 >60.00 mL/min    Comment: Calculated using the CKD-EPI Creatinine Equation (2021)   Calcium 9.9 8.4 - 10.5 mg/dL  CBG monitoring, ED     Status: Abnormal   Collection Time: 02/26/22 11:37 AM  Result Value Ref Range   Glucose-Capillary 110 (H) 70 - 99 mg/dL    Comment: Glucose reference range applies only to samples taken after fasting for at least 8 hours.   Comment 1 RN AWARE   Basic metabolic panel     Status: Abnormal   Collection Time: 02/26/22  1:54 PM  Result Value Ref Range   Sodium 139 135 - 145 mmol/L   Potassium 4.3 3.5 -  5.1 mmol/L   Chloride 105 98 - 111 mmol/L   CO2 22 22 - 32 mmol/L   Glucose, Bld 121 (H) 70 - 99 mg/dL    Comment: Glucose reference range applies only to samples taken after fasting for at least 8 hours.   BUN 7 6 - 20 mg/dL   Creatinine, Ser 0.38 (L) 0.44 - 1.00 mg/dL   Calcium 9.7 8.9 - 10.3 mg/dL   GFR, Estimated >60 >60 mL/min    Comment: (NOTE) Calculated using the CKD-EPI Creatinine Equation (2021)    Anion gap 12 5 - 15    Comment: Performed at [facility], [address], [place], [place] [address]  CBC     Status: None   Collection Time: 02/26/22  1:54 PM  Result Value Ref Range   WBC 6.8 4.0 - 10.5 K/uL   RBC 4.71 3.87 - 5.11 MIL/uL   Hemoglobin 14.0 12.0 - 15.0 g/dL   HCT 40.7 36.0 - 46.0 %   MCV 86.4 80.0 - 100.0 fL   MCH 29.7 26.0 - 34.0 pg   MCHC 34.4 30.0 - 36.0 g/dL   RDW 12.6 11.5 - 15.5 %   Platelets 284 150 - 400 K/uL   nRBC 0.0 0.0 - 0.2 %    Comment: Performed at [facility], [address], [place], [place] [address]  Urinalysis, Routine w reflex microscopic Urine, Clean Catch     Status: Abnormal   Collection Time: 02/26/22  1:54 PM  Result Value Ref Range   Color, Urine ORANGE (A) YELLOW    Comment: BIOCHEMICALS MAY BE AFFECTED BY COLOR   APPearance HAZY (A) CLEAR   Specific Gravity, Urine 1.008 1.005 - 1.030   pH 7.5 5.0 - 8.0   Glucose, UA NEGATIVE NEGATIVE mg/dL   Hgb urine dipstick LARGE (A) NEGATIVE   Bilirubin Urine NEGATIVE NEGATIVE   Ketones, ur NEGATIVE NEGATIVE mg/dL   Protein, ur TRACE (A) NEGATIVE mg/dL   Nitrite NEGATIVE NEGATIVE   Leukocytes,Ua SMALL (A) NEGATIVE   RBC / HPF >50 (H) 0 - 5 RBC/hpf   WBC, UA 0-5 0 - 5 WBC/hpf   Squamous Epithelial / LPF 0-5 0 - 5   Mucus PRESENT     Comment: Performed at [facility], [address] [place], [place] [address]  Pregnancy, urine     Status: None   Collection Time: 02/26/22  1:54 PM  Result Value Ref Range   Preg Test, Ur NEGATIVE NEGATIVE    Comment:        THE SENSITIVITY OF THIS METHODOLOGY IS >20 mIU/mL. Performed at [facility], [address] [place],  [address]   CBG monitoring, ED     Status: Abnormal   Collection Time: 02/26/22  2:10 PM  Result Value Ref Range   Glucose-Capillary 113 (H) 70 - 99 mg/dL    Comment: Glucose reference range applies only to samples taken after fasting for at least 8 hours.  Resp Panel by RT-PCR (Flu A&B, Covid) Anterior Nasal Swab     Status: None   Collection Time: 02/26/22  2:32 PM   Specimen: Anterior Nasal Swab  Result Value Ref Range   SARS Coronavirus 2 by RT PCR NEGATIVE NEGATIVE    Comment: (NOTE) SARS-CoV-2 target nucleic acids are NOT DETECTED.  The SARS-CoV-2 RNA is generally detectable in upper respiratory specimens during the acute phase of infection. The lowest concentration of SARS-CoV-2 viral copies this assay can detect is 138  copies/mL. A negative result does  not preclude SARS-Cov-2 infection and should not be used as the sole basis for treatment or other patient management decisions. A negative result may occur with  improper specimen collection/handling, submission of specimen other than nasopharyngeal swab, presence of viral mutation(s) within the areas targeted by this assay, and inadequate number of viral copies(<138 copies/mL). A negative result must be combined with clinical observations, patient history, and epidemiological information. The expected result is Negative.  Fact Sheet for Patients:  EntrepreneurPulse.com.au  Fact Sheet for Healthcare Providers:  IncredibleEmployment.be  This test is no t yet approved or cleared by the [place] FDA and  has been authorized for detection and/or diagnosis of SARS-CoV-2 by FDA under an Emergency Use Authorization (EUA). This EUA will remain  in effect (meaning this test can be used) for the duration of the COVID-19 declaration under Section 564(b)(1) of the Act, 21 U.S.C.section 360bbb-3(b)(1), unless the authorization is terminated  or revoked sooner.       Influenza A by PCR NEGATIVE NEGATIVE   Influenza B by PCR NEGATIVE NEGATIVE    Comment: (NOTE) The Xpert Xpress SARS-CoV-2/FLU/RSV plus assay is intended as an aid in the diagnosis of influenza from Nasopharyngeal swab specimens and should not be used as a sole basis for treatment. Nasal washings and aspirates are unacceptable for Xpert Xpress SARS-CoV-2/FLU/RSV testing.  Fact Sheet for Patients: EntrepreneurPulse.com.au  Fact Sheet for Healthcare Providers: IncredibleEmployment.be  This test is not yet approved or cleared by the [place] FDA and has been authorized for detection and/or diagnosis of SARS-CoV-2 by FDA under an Emergency Use Authorization (EUA). This EUA will remain in effect (meaning this test can be used) for the duration of  the COVID-19 declaration under Section 564(b)(1) of the Act, 21 U.S.C. section 360bbb-3(b)(1), unless the authorization is terminated or revoked.  Performed at [facility], [address], [place], [place] [address]   CBG monitoring, ED     Status: None   Collection Time: 02/26/22  3:34 PM  Result Value Ref Range   Glucose-Capillary 88 70 - 99 mg/dL    Comment: Glucose reference range applies only to samples taken after fasting for at least 8 hours.

## 2022-02-27 NOTE — Telephone Encounter (Signed)
Called [employer] at [phone] spoke to Jonny Ruiz, told him calling about PA for [employer]. I received a fax stating I filled out the wrong form but I filled out what was faxed to me. John said just answer few more questions and I can do it over the phone for you. Told him okay, answered the questions. John said will have a response in 24-72 hours and they will send a fax. Told him okay thanks.

## 2022-02-27 NOTE — Patient Instructions (Signed)
It was a pleasure seeing you today!  Today the plan is...  Hypoglycemia -     blood glucose meter kit and supplies; Dispense based on patient and insurance preference. Use up to four times daily as directed.  Dispense: 1 each; Refill: 0 -     Glucose -     Glucagon (rDNA); Use for low blood sugar.  Dispense: 2 each; Refill: 3 -     ACTH; Future -     Cortisol; Future -     Insulin, Free (Bioactive); Future -     Insulin-like growth factor; Future -     C-peptide; Future -     Hepatic function panel; Future -     Insulin antibodies, blood; Future -     Proinsulin; Future -     Sulfonylurea Hypoglycemics Panel, blood; Future -     Beta-hydroxybutyric acid; Future      Loralee Pacas, MD   No follow-ups on file.    - Please bring all your medicines to your next appointment. This is the best way for me to know exactly what you're taking.  - If your condition begins to worsen or become severe:  go to the ER. - If your condition fails to resolve or you have other questions / concerns: please contact me via phone [phone] or MyChart messaging.     IF you received an x-ray today, you will receive an invoice from [facility]. Please contact [facility] at [phone] with questions or concerns regarding your invoice.    IF you received labwork today, you will receive an invoice from [facility]. Please contact [facility] at [phone] with questions or concerns regarding your invoice.    Our billing staff will not be able to assist you with questions regarding bills from these companies.   You will be contacted with the lab results as soon as they are available. The fastest way to get your results is to activate your My Chart account. Instructions are located on the last page of this paperwork. If you have not heard from [place] regarding the results in 2 weeks, please contact this office. For any labs or imaging tests, we will call you if the results are significantly  abnormal.  Most normal results will be posted to myChart as soon as they are available and I will comment on them there within 2-3 business days.      Your guide to finding low GI foods to keep your blood sugar stable [employer]'s [employer] explains that the glycemic index (GI) of a food is a measure of how a specific food with carbohydrates causes blood sugar to rise. It is given as a number compared to the effect on your blood sugar of eating either glucose (a type of sugar) or white bread, according to research published in the journal Diabetes Care.  When you eat a food or beverage containing carbohydrates, your body breaks down the carbs into a type of sugar called glucose. The glucose goes into your bloodstream and causes your blood glucose (blood sugar) levels to rise, according to the Centers for Disease Control and Prevention.  Compared to low-glycemic foods, high-glycemic foods lead to a quicker and greater spike in blood sugar levels. These spikes are considered unhealthy for a few reasons.  They raise your average blood glucose levels They place a higher demand for insulin on your body They lead to more dramatic dips in blood glucose after the spike, potentially causing hunger, carbohydrate  cravings, and weakness A low-GI diet and meal plan can help you avoid the blood sugar roller coaster, but the GI is not on the nutrition label of most foods. Instead, you will probably to look at a list of the GI of foods.  Low-GI foods are often less processed, and higher in protein, fiber, fat, and/or complex rather than refined carbohydrates.  According to the [facility], following are several low-glycemic foods:  Glycemic Index and Diabetes Can a low-GI diet lower blood sugar if you have prediabetes or diabetes? Probably, if you choose wisely. Low-GI foods are less likely to cause blood sugar spikes, and more likely to keep blood sugar stable in healthier ranges. You  can help control prediabetes or diabetes with a diet based on nutritious, low-GI foods such as vegetables, beans, low-fat dairy, and whole grains, along with healthy proteins and fats. However, a diet high in unhealthy fats, fried foods, and processed meat may be low-GI, but is not likely to lower blood sugar. Low GI Food List Peaches Low-Glycemic Fruit  Apples Dried apricots Under-ripe banana Peaches Strawberries Oranges Cherries Coconut Cranberries Blueberries Pears Plums Grapefruit Many people believe that they should avoid eating fruit because of its sugar, but fruits are among the healthiest foods you can eat. They not only have nutrients such as fiber, potassium, and a range of antioxidants, but are linked to lower risk for many diseases.  Eggplant is a low GI veggie  Low-Glycemic Vegetables  Carrots Green peas Onions Lettuce Greens (spinach, kale, collards, beet) Green beans Tomatoes Cucumbers Bok choy Mushrooms Artichokes Brussels sprouts Cabbage Broccoli Cauliflower Celery Eggplant Peppers (bell peppers, jalapenos, serrano, etc.) Zucchini and crookneck squash Snow peas Glycemic Index, Weight Loss, and Health Can a low-GI diet help you lose weight? Yes, but only if you are careful. Any diet can help you lose weight if you use it to limit calories.  To lose weight on a low-GI diet:  Choose healthy, filling low-GI carbs, such as vegetables, beans, low-fat dairy, berries, and whole grains. Include lean proteins, such as tofu, chicken, fish, and eggs, and healthy fats, such as nuts, avocados, and olive oil. Include nutritious "no-GI" foods (lean proteins and healthy fats). Limit low-nutrition high-GI foods such as sweets, sugary beverages, and refined starches. Beware of low-GI foods that are high-calorie and low-nutrition, such as pizza and ice cream. Whole wheat kernels Low-Glycemic Grains  Barley Whole wheat kernels All-bran and Fiber One cereals Oat  bran and rice bran cereals Whole grain pasta Lasagna with meat and/or cheese, ravioli, tortellini, and other stuffed pasta Whole-grain pumpernickel bread Sourdough bread Wheat tortilla Plain yogurt Low-Glycemic Dairy Products and Dairy-Substitute Products  Skim, low-fat, and whole milk Plain yogurt Cheese (cheddar, swiss, mozzarella, brie, feta, blue, goat, etc.) Cottage cheese Ricotta cheese Soy milk and yogurt Beans (and legumes) are a good choice for diabetics Low-Glycemic Legumes  Beans (chickpeas, kidney beans, pinto beans, black beans, navy beans, etc.) Lima beans Split peas, black-eyed peas Lentils Edamame and roasted soybeans Hummus Bean dip Tofu and soy-based meat substitutes Guessing the Glycemic Index What if you do not know the glycemic index of a food? That could happen if food has not have been measured or if you do not have time to check its GI before you eat it.  You cannot know for sure, but the GI tends to be lower when:  It is less processed. Whole grains are often lower-GI than refined, whole fruit is lower-GI than fruit juice, and raw carrots  are lower-GI than cooked. It is higher in fiber, protein, and/or fat, and lower in sugar and refined starch. It is raw. For example, raw carrots are lower-GI than cooked, and al dente pasta is lower-GI than well-cooked. Ripeness. A soft, ripe banana has a higher-GI than a firmer, less ripe one. When you cannot get the GI for a food, going with your gut is usually a smart choice. You cannot go wrong when you eat foods that you know are healthy, limit junk food, and keep portions in check.  Nuts Low-Glycemic Nuts and Seeds  Peanuts Nuts (walnuts, macadamias, hazelnuts, almonds, cashews, etc.) Peanut butter Nut butter Seeds (pumpkin, sunflower, chia, flax, etc.) Dark chocolate Other/Mixed Foods  Dark chocolate and 100% (unsweetened) chocolate Chili with beans Sandwich on whole-grain bread with meat/tuna/cheese  and vegetables Peanut butter sandwich on whole-grain bread Burrito with beans, cheese, guacamole, salsa, and lettuce on whole-wheat tortilla Soups with protein and vegetables Salads with vegetables and beans, cheese, chicken, tuna, dressing, and/or nuts Low-Glycemic, Less-Nutritious (Limit These)  Snickers bar Pizza Tacos Banana bread Muffins Egg rolls Medium/High-Glycemic Nutritious Foods (Enjoy in Moderation)  Sweet potatoes Whole-grain bread Butternut, acorn, and other winter squash Oatmeal Shredded wheat and many whole-grain breakfast cereals (choose unsweetened) Brown rice (avoid white rice) Melon High-GI foods are often more refined, cooked, or otherwise processed, higher in simple sugars and/or refined starches, and lower in protein, fiber, and fat.  Fish, like salmon and halibut are prediabetes friendly foods Carb-Free and Very Low-Carb Foods (Very Low GI)  Chicken, [place], and other poultry Eggs and egg whites Fish and shellfish Beef, pork, and other meat Olive oil, canola oil, and other oils Butter, shortening, and lard Mayonnaise Olives Avocado Low-Glycemic May Not Mean Healthy (and [place]) The GI is only one way to assess a food. Lower-GI often means healthier, but not always. For example: You can lower the GI of a slice of bread by spreading it with butter. That is not healthy! Boiled potatoes are high-GI and [place] fries are lower, but fries are not healthy! Oatmeal and pumpkin are high-GI, but they are rich in healthy antioxidants and fiber.

## 2022-02-28 ENCOUNTER — Other Ambulatory Visit (INDEPENDENT_AMBULATORY_CARE_PROVIDER_SITE_OTHER): Payer: BLUE CROSS/BLUE SHIELD

## 2022-02-28 DIAGNOSIS — E162 Hypoglycemia, unspecified: Secondary | ICD-10-CM

## 2022-02-28 LAB — HEPATIC FUNCTION PANEL
ALT: 36 U/L — ABNORMAL HIGH (ref 0–35)
AST: 23 U/L (ref 0–37)
Albumin: 4.1 g/dL (ref 3.5–5.2)
Alkaline Phosphatase: 58 U/L (ref 39–117)
Bilirubin, Direct: 0.1 mg/dL (ref 0.0–0.3)
Total Bilirubin: 0.6 mg/dL (ref 0.2–1.2)
Total Protein: 7.1 g/dL (ref 6.0–8.3)

## 2022-02-28 LAB — CORTISOL: Cortisol, Plasma: 10.2 ug/dL

## 2022-03-01 ENCOUNTER — Telehealth: Payer: Self-pay | Admitting: Physician Assistant

## 2022-03-01 NOTE — Telephone Encounter (Signed)
Spoke to pt told her received fax from [employer] Rybelsus has been approved. I will call the pharmacy as soon as it opens so you can get your medicine today. Pt verbalized understanding. Asked pt how she was feeling? Pt said still tired, but seeing Dr. Nehemiah Massed on Tuesday. Told her good.

## 2022-03-01 NOTE — Telephone Encounter (Signed)
Type of form received: [employer] Medical Evaluation   Additional comments:  Unknown how form received.  Received by: unk    Form placed:   In provider's box  Attach charge sheet.  Y  Individual made aware of 3-5 business day turn around (Y/N)?  unk

## 2022-03-01 NOTE — Telephone Encounter (Signed)
[facility] pharmacy and spoke to Charlton told her Rybelsus has been approved. Carollee Herter verbalized understanding.

## 2022-03-04 ENCOUNTER — Encounter: Payer: Self-pay | Admitting: Internal Medicine

## 2022-03-05 ENCOUNTER — Encounter: Payer: Self-pay | Admitting: Internal Medicine

## 2022-03-05 ENCOUNTER — Ambulatory Visit (INDEPENDENT_AMBULATORY_CARE_PROVIDER_SITE_OTHER): Payer: BLUE CROSS/BLUE SHIELD | Admitting: Internal Medicine

## 2022-03-05 VITALS — BP 114/70 | HR 77 | Ht 60.0 in | Wt 245.4 lb

## 2022-03-05 DIAGNOSIS — E0500 Thyrotoxicosis with diffuse goiter without thyrotoxic crisis or storm: Secondary | ICD-10-CM | POA: Diagnosis not present

## 2022-03-05 DIAGNOSIS — E0590 Thyrotoxicosis, unspecified without thyrotoxic crisis or storm: Secondary | ICD-10-CM

## 2022-03-05 DIAGNOSIS — E119 Type 2 diabetes mellitus without complications: Secondary | ICD-10-CM | POA: Diagnosis not present

## 2022-03-05 MED ORDER — METHIMAZOLE 5 MG PO TABS: 5.0000 mg | ORAL_TABLET | Freq: Every day | ORAL | 2 refills | Status: DC

## 2022-03-05 NOTE — Telephone Encounter (Signed)
Spoke to pt, was able to resolve situation with pharmacy, does not need accu check machine ordered through [place] anymore.
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
## 2022-03-05 NOTE — Telephone Encounter (Signed)
Patient said she saw her endocrinologist today and was told that she thinks her thyroid is causing it and her not being on diabetic medication, was advised to continue with the methimazole. She is confused and unsure of what to do at this point.

## 2022-03-08 ENCOUNTER — Other Ambulatory Visit: Payer: Self-pay | Admitting: Physician Assistant

## 2022-03-08 LAB — INSULIN-LIKE GROWTH FACTOR
IGF-I, LC/MS: 124 ng/mL (ref 53–331)
Z-Score (Female): -0.4 SD (ref ?–2.0)

## 2022-03-08 LAB — INSULIN, FREE (BIOACTIVE): Insulin, Free: 25.6 u[IU]/mL — ABNORMAL HIGH (ref 1.5–14.9)

## 2022-03-08 LAB — SULFONYLUREA HYPOGLYCEMICS PANEL, SERUM

## 2022-03-08 LAB — ACTH

## 2022-03-08 LAB — BETA-HYDROXYBUTYRATE: Beta-Hydroxybutyric Acid: 0.18 mmol/L

## 2022-03-08 LAB — INSULIN ANTIBODIES, BLOOD: Insulin Antibodies, Human: <0.4 U/mL (ref <0.4)

## 2022-03-08 LAB — PROINSULIN

## 2022-03-08 LAB — C-PEPTIDE: C-Peptide: 4.21 ng/mL — ABNORMAL HIGH (ref 0.80–3.85)

## 2022-03-08 MED ORDER — OZEMPIC (0.25 OR 0.5 MG/DOSE) 2 MG/3ML ~~LOC~~ SOPN: 0.2500 mg | PEN_INJECTOR | SUBCUTANEOUS | 1 refills | Status: DC

## 2022-03-08 NOTE — Telephone Encounter (Signed)
Form completed and signed, placed up front for patient pick-up. Patient notified.

## 2022-03-14 ENCOUNTER — Telehealth: Payer: Self-pay | Admitting: *Deleted

## 2022-03-14 NOTE — Telephone Encounter (Signed)
Received fax from pharmacy PA needed for Ozempic. PA done thru [employer]. Received response right away Ozempic has been approved 03/14/2022-- 03/13/2023.

## 2022-03-15 NOTE — Telephone Encounter (Signed)
Spoke to pt told her Ozempic has been approved for 1 yr. Pt verbalized understanding.

## 2022-03-18 ENCOUNTER — Encounter: Payer: Self-pay | Admitting: Physician Assistant

## 2022-03-18 NOTE — Telephone Encounter (Signed)
Called [facility] spoke to Adria told her PA for Larna Daughters has been approved for one year. Adria verbalized understanding and she said they will try to readmit it. Told her okay.

## 2022-03-19 LAB — INSULIN, FREE (BIOACTIVE): Insulin, Free: 28.4 u[IU]/mL — ABNORMAL HIGH (ref 1.5–14.9)

## 2022-03-19 LAB — ACTH: C206 ACTH: 8 pg/mL (ref 6–50)

## 2022-03-21 ENCOUNTER — Other Ambulatory Visit: Payer: Self-pay | Admitting: Internal Medicine

## 2022-03-21 DIAGNOSIS — E162 Hypoglycemia, unspecified: Secondary | ICD-10-CM

## 2022-03-21 MED ORDER — GVOKE HYPOPEN 2-PACK 0.5 MG/0.1ML ~~LOC~~ SOAJ: 1.0000 | Freq: Four times a day (QID) | SUBCUTANEOUS | 11 refills | Status: DC | PRN

## 2022-03-21 NOTE — Progress Notes (Signed)
Sending in [place] for low sugars repeatedly.

## 2022-04-26 ENCOUNTER — Encounter: Payer: Self-pay | Admitting: Physician Assistant

## 2022-04-30 ENCOUNTER — Other Ambulatory Visit: Payer: BLUE CROSS/BLUE SHIELD

## 2022-05-07 ENCOUNTER — Other Ambulatory Visit (INDEPENDENT_AMBULATORY_CARE_PROVIDER_SITE_OTHER): Payer: BLUE CROSS/BLUE SHIELD

## 2022-05-07 ENCOUNTER — Encounter: Payer: Self-pay | Admitting: Internal Medicine

## 2022-05-07 DIAGNOSIS — E0590 Thyrotoxicosis, unspecified without thyrotoxic crisis or storm: Secondary | ICD-10-CM

## 2022-05-07 LAB — TSH: TSH: <0.01 u[IU]/mL — ABNORMAL LOW (ref 0.35–5.50)

## 2022-05-07 LAB — T4, FREE: Free T4: 2.16 ng/dL — ABNORMAL HIGH (ref 0.60–1.60)

## 2022-05-07 MED ORDER — METHIMAZOLE 5 MG PO TABS: 5.0000 mg | ORAL_TABLET | Freq: Two times a day (BID) | ORAL | 2 refills | Status: DC

## 2022-05-08 LAB — T3: T3, Total: 251 ng/dL — ABNORMAL HIGH (ref 76–181)

## 2022-05-09 ENCOUNTER — Encounter: Payer: BLUE CROSS/BLUE SHIELD | Admitting: Physician Assistant

## 2022-05-31 ENCOUNTER — Encounter: Payer: Self-pay | Admitting: Physician Assistant

## 2022-05-31 ENCOUNTER — Ambulatory Visit (INDEPENDENT_AMBULATORY_CARE_PROVIDER_SITE_OTHER): Payer: BLUE CROSS/BLUE SHIELD | Admitting: Physician Assistant

## 2022-05-31 VITALS — BP 110/72 | HR 80 | Temp 97.5°F | Ht 60.0 in | Wt 248.0 lb

## 2022-05-31 DIAGNOSIS — E0590 Thyrotoxicosis, unspecified without thyrotoxic crisis or storm: Secondary | ICD-10-CM | POA: Diagnosis not present

## 2022-05-31 DIAGNOSIS — Z0000 Encounter for general adult medical examination without abnormal findings: Secondary | ICD-10-CM | POA: Diagnosis not present

## 2022-05-31 DIAGNOSIS — F419 Anxiety disorder, unspecified: Secondary | ICD-10-CM

## 2022-05-31 DIAGNOSIS — E1165 Type 2 diabetes mellitus with hyperglycemia: Secondary | ICD-10-CM | POA: Diagnosis not present

## 2022-05-31 LAB — CBC WITH DIFFERENTIAL/PLATELET
Basophils Absolute: 0 10*3/uL (ref 0.0–0.1)
Basophils Relative: 0.6 % (ref 0.0–3.0)
Eosinophils Absolute: 0.1 10*3/uL (ref 0.0–0.7)
Eosinophils Relative: 2.1 % (ref 0.0–5.0)
HCT: 45.8 % (ref 36.0–46.0)
Hemoglobin: 15.6 g/dL — ABNORMAL HIGH (ref 12.0–15.0)
Lymphocytes Relative: 41.3 % (ref 12.0–46.0)
Lymphs Abs: 2.8 10*3/uL (ref 0.7–4.0)
MCHC: 34.1 g/dL (ref 30.0–36.0)
MCV: 86.7 fl (ref 78.0–100.0)
Monocytes Absolute: 0.6 10*3/uL (ref 0.1–1.0)
Monocytes Relative: 8.5 % (ref 3.0–12.0)
Neutro Abs: 3.2 10*3/uL (ref 1.4–7.7)
Neutrophils Relative %: 47.5 % (ref 43.0–77.0)
Platelets: 291 10*3/uL (ref 150.0–400.0)
RBC: 5.28 Mil/uL — ABNORMAL HIGH (ref 3.87–5.11)
RDW: 13.3 % (ref 11.5–15.5)
WBC: 6.7 10*3/uL (ref 4.0–10.5)

## 2022-05-31 LAB — MICROALBUMIN / CREATININE URINE RATIO
Creatinine,U: 42.8 mg/dL
Microalb Creat Ratio: 1.6 mg/g (ref 0.0–30.0)
Microalb, Ur: <0.7 mg/dL (ref 0.0–1.9)

## 2022-05-31 LAB — COMPREHENSIVE METABOLIC PANEL
ALT: 39 U/L — ABNORMAL HIGH (ref 0–35)
AST: 22 U/L (ref 0–37)
Albumin: 4.5 g/dL (ref 3.5–5.2)
Alkaline Phosphatase: 86 U/L (ref 39–117)
BUN: 9 mg/dL (ref 6–23)
CO2: 27 mEq/L (ref 19–32)
Calcium: 10 mg/dL (ref 8.4–10.5)
Chloride: 102 mEq/L (ref 96–112)
Creatinine, Ser: 0.41 mg/dL (ref 0.40–1.20)
GFR: 128.87 mL/min (ref >60.00)
Glucose, Bld: 124 mg/dL — ABNORMAL HIGH (ref 70–99)
Potassium: 4.1 mEq/L (ref 3.5–5.1)
Sodium: 138 mEq/L (ref 135–145)
Total Bilirubin: 0.4 mg/dL (ref 0.2–1.2)
Total Protein: 7.6 g/dL (ref 6.0–8.3)

## 2022-05-31 LAB — HEMOGLOBIN A1C: Hgb A1c MFr Bld: 7.1 % — ABNORMAL HIGH (ref 4.6–6.5)

## 2022-05-31 LAB — LIPID PANEL
Cholesterol: 189 mg/dL (ref 0–200)
HDL: 41.8 mg/dL (ref >39.00)
LDL Cholesterol: 109 mg/dL — ABNORMAL HIGH (ref 0–99)
NonHDL: 147.43
Total CHOL/HDL Ratio: 5
Triglycerides: 193 mg/dL — ABNORMAL HIGH (ref 0.0–149.0)
VLDL: 38.6 mg/dL (ref 0.0–40.0)

## 2022-05-31 MED ORDER — TIRZEPATIDE 5 MG/0.5ML ~~LOC~~ SOAJ: 5.0000 mg | SUBCUTANEOUS | 0 refills | Status: DC

## 2022-05-31 NOTE — Patient Instructions (Addendum)
It was great to see you!  Trial Mounjaro -- I will also send this in. Let me know if this does not work well for you.  Follow-up in 3 months.  Please go to the lab for blood work.   Our office will call you with your results unless you have chosen to receive results via MyChart.  If your blood work is normal we will follow-up each year for physicals and as scheduled for chronic medical problems.  If anything is abnormal we will treat accordingly and get you in for a follow-up.  Take care,  [name]
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
## 2022-06-03 ENCOUNTER — Telehealth: Payer: Self-pay | Admitting: *Deleted

## 2022-06-03 NOTE — Telephone Encounter (Signed)
Spoke to pt told her received message through Covermymeds need PA for [facility]. Told pt I tried to do PA,but insurance that was in said not found. Aksed pt [employer] do you want this run under? Pt said [employer]. Told pt need to contact pharmacy and have them run it again under correct insurance and if it comes back needs PA they will let me know. Pt verbalized understanding and will call pharmacy now.

## 2022-06-04 ENCOUNTER — Encounter: Payer: Self-pay | Admitting: Physician Assistant

## 2022-06-04 ENCOUNTER — Other Ambulatory Visit: Payer: Self-pay | Admitting: Physician Assistant

## 2022-06-04 MED ORDER — ONDANSETRON HCL 4 MG PO TABS: 4.0000 mg | ORAL_TABLET | Freq: Three times a day (TID) | ORAL | 0 refills | Status: DC | PRN

## 2022-06-12 ENCOUNTER — Telehealth: Payer: Self-pay | Admitting: *Deleted

## 2022-06-12 NOTE — Telephone Encounter (Signed)
Received fax from pharmacy PA needed for Mounjaro 5 mg. PA done thru Covermymeds. Awaiting response. KEY: [id number]

## 2022-06-17 NOTE — Telephone Encounter (Signed)
Spoke to pt told her PA for Mounjaro 5 mg has been approved till 03/13/2023. Told her pharmacy was notified, but does not have in stock. Told her if you find it at another pharmacy let me know and I will send Rx there. Pt verbalized understanding.

## 2022-06-17 NOTE — Telephone Encounter (Signed)
Received response thru Covermymeds PA for Mounjaro 5 mg has been approved 03/14/2022 to 03/13/2023.  [facility] pharmacy spoke to [place] told her PA for Greggory Keen has been approved 03/14/2022 to 03/13/2023. Carollee Herter verbalized understanding and said they do not have any in stock at present. Told her I will let pt know.

## 2022-06-19 NOTE — Telephone Encounter (Signed)
Spoke to pt told her I was wrong Mounjaro 5 mg has been denied. Another person did the prior authorization for me and when I went in it must of been an old medication that was approved. Pt verbalized understanding. Told her to check with insurance to see if there is something we can order that is covered and affordable and let us know. Pt verbalized understanding.

## 2022-07-02 ENCOUNTER — Encounter: Payer: Self-pay | Admitting: Physician Assistant

## 2022-07-02 NOTE — Telephone Encounter (Signed)
Heidi Ellison with [employer] states Patient has requested an Appeal be submitted for Denial of Mounjaro 5 mg. States chart notes are needed for the lab values of greater or equal to 6.5% reflecting Type 2 Diabetes Mellitus

## 2022-07-03 ENCOUNTER — Encounter: Payer: Self-pay | Admitting: Neurology

## 2022-07-03 NOTE — Telephone Encounter (Signed)
The patient was last seen on 07/24/21 by Dr. Rexene Alberts. Since its almost been a year she would need a new visit.

## 2022-07-04 NOTE — Telephone Encounter (Signed)
Received fax from [facility] insurance, [place] for [place] was denied. Appeal done, office notes and lab results for A1c's faxed over to [facility] at [phone]. Awaiting determination.

## 2022-07-05 NOTE — Telephone Encounter (Signed)
I have sent over info for appeal, waiting for determination.

## 2022-07-08 ENCOUNTER — Ambulatory Visit (INDEPENDENT_AMBULATORY_CARE_PROVIDER_SITE_OTHER): Payer: 59 | Admitting: Neurology

## 2022-07-08 ENCOUNTER — Encounter: Payer: Self-pay | Admitting: Neurology

## 2022-07-08 VITALS — BP 106/58 | HR 70 | Ht 60.0 in | Wt 244.0 lb

## 2022-07-08 DIAGNOSIS — G4733 Obstructive sleep apnea (adult) (pediatric): Secondary | ICD-10-CM

## 2022-07-08 DIAGNOSIS — G4719 Other hypersomnia: Secondary | ICD-10-CM

## 2022-07-08 DIAGNOSIS — R519 Headache, unspecified: Secondary | ICD-10-CM

## 2022-07-08 NOTE — Patient Instructions (Signed)
It was nice to see you again today.  As discussed, we will proceed with a sleep study to reevaluate you for sleep apnea and consider treatment depending on the results. Please do not drive when feeling sleepy.  We will plan a follow-up after testing, we will reach out to you to schedule your study and also with the results in the interim.
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
## 2022-07-09 ENCOUNTER — Encounter: Payer: Self-pay | Admitting: Physician Assistant

## 2022-07-09 NOTE — Telephone Encounter (Signed)
Spoke to pt asked her when she received letter? Pt said this morning. Told her I sent over what they asked for on 07/04/2022. I am not sure what else they need. I sent over A1c results and last office note. Pt verbalized understanding and said she will call insurance later to see what else they need and send me a message. Told her okay.

## 2022-07-15 NOTE — Telephone Encounter (Signed)
Called pt asked her if she has heard from [facility] due to I have not heard anything yet. Pt said no, she did call them and they said should hear something by the 19th. Told her okay, if you hear something please let me know in case you hear before me. Pt verbalized understanding.

## 2022-07-23 NOTE — Telephone Encounter (Signed)
Received fax from [facility] appeal denied for Mounjaro 5 mg need more info.

## 2022-07-29 ENCOUNTER — Other Ambulatory Visit (HOSPITAL_COMMUNITY): Payer: Self-pay

## 2022-07-30 NOTE — Telephone Encounter (Signed)
See other message, sent to [facility].

## 2022-07-30 NOTE — Telephone Encounter (Signed)
Heidi Ellison, we have tried several times to contact pt's insurance [name], [name] and I and we can get through to anyone to see what we need to do in order to get [facility] approved. Please advise.

## 2022-08-10 LAB — HM DIABETES EYE EXAM

## 2022-08-13 ENCOUNTER — Telehealth: Payer: Self-pay | Admitting: Pharmacist

## 2022-08-13 NOTE — Telephone Encounter (Signed)
Patient was identified at having Managed Medicaid. She was idenitifed for Clinical Pharmacist Practitioner interventions to assist with getting medication for type 2 DM.  Looks like she was having difficulty getting Mounjaro filled.   Unable to reach patient today but LM on VM with my CB# [phone]  Cherre Robins, PharmD Clinical Pharmacist [facility]

## 2022-08-16 MED ORDER — OZEMPIC (0.25 OR 0.5 MG/DOSE) 2 MG/3ML ~~LOC~~ SOPN: 0.2500 mg | PEN_INJECTOR | SUBCUTANEOUS | 2 refills | Status: DC

## 2022-08-20 ENCOUNTER — Telehealth: Payer: Self-pay | Admitting: Pharmacist

## 2022-08-20 NOTE — Telephone Encounter (Signed)
Second attempt to reach patient regarding medication manangement and diabetes therapy.  Unable to reach patient. LM on VM. CB # [phone]   Cherre Robins, PharmD Clinical Pharmacist [facility]

## 2022-08-22 ENCOUNTER — Telehealth: Payer: Self-pay | Admitting: *Deleted

## 2022-08-22 NOTE — Telephone Encounter (Signed)
PA needed for Ozempic 0.25 mg, completed thru Covermymeds. Received response right away: Your request has been approved PA Case: [id number], Status: Approved, Coverage Starts on: 08/22/2022 12:00:00 AM, Coverage Ends on: 08/22/2023 12:00:00 AM.

## 2022-08-22 NOTE — Telephone Encounter (Signed)
[place] spoke April to told her PA for Ozempic has been approved for one year. April Verbalized understanding.

## 2022-08-22 NOTE — Telephone Encounter (Signed)
Spoke to pt told her PA for Ozempic was approved for one year. Told her I tried the pharmacy, but no one answered I will try again later. Pt verbalized understanding.

## 2022-08-26 ENCOUNTER — Telehealth: Payer: Self-pay | Admitting: Neurology

## 2022-08-26 NOTE — Telephone Encounter (Signed)
[facility]ST- M[facility] Healthy blue no auth req.  Patient r/s for 09/03/22 at 1:15 pm.  Anola Gurney message with new appointment information.

## 2022-08-30 ENCOUNTER — Ambulatory Visit: Payer: BLUE CROSS/BLUE SHIELD | Admitting: Physician Assistant
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
## 2022-09-03 ENCOUNTER — Ambulatory Visit: Payer: BLUE CROSS/BLUE SHIELD | Admitting: Internal Medicine

## 2022-09-03 ENCOUNTER — Ambulatory Visit: Payer: Medicaid Other | Admitting: Neurology

## 2022-09-03 DIAGNOSIS — G4733 Obstructive sleep apnea (adult) (pediatric): Secondary | ICD-10-CM

## 2022-09-03 DIAGNOSIS — R519 Headache, unspecified: Secondary | ICD-10-CM

## 2022-09-03 DIAGNOSIS — G4719 Other hypersomnia: Secondary | ICD-10-CM

## 2022-09-04 NOTE — Addendum Note (Signed)
Addended by: Star Age on: 09/04/2022 06:03 PM   Modules accepted: Orders

## 2022-09-04 NOTE — Progress Notes (Signed)
See procedure note.

## 2022-09-04 NOTE — Procedures (Signed)
   [facility]  HOME SLEEP TEST (Watch PAT) REPORT  STUDY DATE: 09/03/2022  DOB: Aug 25, 1990  MRN: [id number]  ORDERING CLINICIAN: Star Age, MD, PhD   REFERRING CLINICIAN: [name], [place]   CLINICAL INFORMATION/HISTORY: 32 year old female with a history of migraine headaches, endometriosis, prediabetes, smoking, anxiety, arthritis, and severe obesity with a BMI of over 45, who reports snoring and daytime somnolence, nonrestorative sleep.  Epworth sleepiness score: 17/24.  BMI: 48 kg/m  FINDINGS:   Sleep Summary:   Total Recording Time (hours, min): 6 hours, 47 min  Total Sleep Time (hours, min):  5 hours, 33 min  Percent REM (%):    33.1%   Respiratory Indices:   Calculated pAHI (per hour):  22.3/hour         REM pAHI:    47/hour       NREM pAHI: 10.1/hour  Central pAHI: 0/hour  Oxygen Saturation Statistics:    Oxygen Saturation (%) Mean: 94%   Minimum oxygen saturation (%):                 82%   O2 Saturation Range (%): 82 - 99%    O2 Saturation (minutes) <=88%: 1.6 min  Pulse Rate Statistics:   Pulse Mean (bpm):    67/min    Pulse Range (42 - 103/min)   IMPRESSION: OSA (obstructive sleep apnea), moderate  RECOMMENDATION:  This home sleep test demonstrates moderate obstructive sleep apnea with a total AHI of 22.3/hour and O2 nadir of 82%.  Mild to moderate snoring was detected, at times in the louder range. Treatment with a positive airway pressure (PAP) device is recommended. The patient will be advised to proceed with an autoPAP titration/trial at home for now. A full night titration study may be considered to optimize treatment settings, monitor proper oxygen saturations and aid with improvement of tolerance and adherence, if needed down the road. Alternative treatment options may include a dental device through dentistry or orthodontics in selected patients or Inspire (hypoglossal nerve stimulator) in carefully selected patients  (meeting inclusion criteria).  Concomitant weight loss is recommended (where clinically appropriate). Please note that untreated obstructive sleep apnea may carry additional perioperative morbidity. Patients with significant obstructive sleep apnea should receive perioperative PAP therapy and the surgeons and particularly the anesthesiologist should be informed of the diagnosis and the severity of the sleep disordered breathing. The patient should be cautioned not to drive, work at heights, or operate dangerous or heavy equipment when tired or sleepy. Review and reiteration of good sleep hygiene measures should be pursued with any patient. Other causes of the patient's symptoms, including circadian rhythm disturbances, an underlying mood disorder, medication effect and/or an underlying medical problem cannot be ruled out based on this test. Clinical correlation is recommended.  The patient and her referring provider will be notified of the test results. The patient will be seen in follow up in sleep clinic at [facility].  I certify that I have reviewed the raw data recording prior to the issuance of this report in accordance with the standards of the American Academy of Sleep Medicine (AASM).    INTERPRETING PHYSICIAN:   Star Age, MD, PhD Medical Director, [place] [facility]) [place], ABPN (Neurology and Sleep)   [facility] [address] [place], [place] [address] [phone]

## 2022-09-05 ENCOUNTER — Telehealth: Payer: Self-pay | Admitting: *Deleted

## 2022-09-05 DIAGNOSIS — M79644 Pain in right finger(s): Secondary | ICD-10-CM | POA: Diagnosis not present

## 2022-09-05 DIAGNOSIS — S63641A Sprain of metacarpophalangeal joint of right thumb, initial encounter: Secondary | ICD-10-CM | POA: Diagnosis not present

## 2022-09-05 NOTE — Telephone Encounter (Signed)
-----   Message from Star Age, MD sent at 09/04/2022  6:03 PM EST ----- Patient referred by PCP, seen by me on 07/08/2022, patient had a HST on 09/04/2022.    Please call and notify the patient that the recent home sleep test showed obstructive sleep apnea in the moderate range. I recommend treatment in the form of autoPAP, which means, that we don't have to bring her in for a sleep study with CPAP, but will let her start using a so called autoPAP machine at home, which is a CPAP-like machine with self-adjusting pressures. We will send the order to a local DME company (of her choice, or as per insurance requirement). The DME representative will fit her with a mask, educate her on how to use the machine, how to put the mask on, etc. I have placed an order in the chart. Please send the order, talk to patient, send report to referring MD. We will need a FU in sleep clinic for 10 weeks post-PAP set up, please arrange that with me or one of our NPs. Also reinforce the need for compliance with treatment. Thanks,   Star Age, MD, PhD [facility])

## 2022-09-05 NOTE — Telephone Encounter (Signed)
Spoke to pt and relayed the result of her sleep study moderate OSA.  Recommend autopap. Use 4hour or more each night for insurance compliance. Will send to [facility], they will authorize thru insurance then call her.  She verbalized understanding.  We will see her back after using machine 11-19-2022 at 1245 with SS/NP.  Pt verbalized understanding.

## 2022-09-06 ENCOUNTER — Ambulatory Visit: Payer: BLUE CROSS/BLUE SHIELD | Admitting: Physician Assistant
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
## 2022-09-09 NOTE — Telephone Encounter (Signed)
Fax confirmation received.  [phone].

## 2022-09-23 DIAGNOSIS — G4733 Obstructive sleep apnea (adult) (pediatric): Secondary | ICD-10-CM | POA: Diagnosis not present

## 2022-09-25 ENCOUNTER — Ambulatory Visit: Payer: BLUE CROSS/BLUE SHIELD | Admitting: Internal Medicine
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
## 2022-10-24 DIAGNOSIS — G4733 Obstructive sleep apnea (adult) (pediatric): Secondary | ICD-10-CM | POA: Diagnosis not present

## 2022-11-06 ENCOUNTER — Encounter: Payer: Self-pay | Admitting: Internal Medicine

## 2022-11-06 ENCOUNTER — Ambulatory Visit: Payer: 59 | Admitting: Internal Medicine

## 2022-11-06 VITALS — BP 122/70 | HR 80 | Ht 60.0 in | Wt 250.0 lb

## 2022-11-06 DIAGNOSIS — E0500 Thyrotoxicosis with diffuse goiter without thyrotoxic crisis or storm: Secondary | ICD-10-CM | POA: Diagnosis not present

## 2022-11-06 DIAGNOSIS — E0590 Thyrotoxicosis, unspecified without thyrotoxic crisis or storm: Secondary | ICD-10-CM | POA: Diagnosis not present

## 2022-11-06 LAB — T4, FREE: Free T4: 0.83 ng/dL (ref 0.60–1.60)

## 2022-11-06 LAB — TSH: TSH: 0.02 u[IU]/mL — ABNORMAL LOW (ref 0.35–5.50)
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
## 2022-11-07 ENCOUNTER — Encounter: Payer: Self-pay | Admitting: Internal Medicine

## 2022-11-07 DIAGNOSIS — E0590 Thyrotoxicosis, unspecified without thyrotoxic crisis or storm: Secondary | ICD-10-CM

## 2022-11-07 LAB — T3: T3, Total: 145 ng/dL (ref 76–181)

## 2022-11-07 MED ORDER — METHIMAZOLE 5 MG PO TABS: 12.5000 mg | ORAL_TABLET | Freq: Every day | ORAL | 2 refills | Status: DC

## 2022-11-14 ENCOUNTER — Encounter: Payer: Self-pay | Admitting: Physician Assistant

## 2022-11-19 ENCOUNTER — Encounter: Payer: Self-pay | Admitting: Neurology

## 2022-11-19 ENCOUNTER — Encounter: Payer: Medicaid Other | Admitting: Neurology
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
## 2022-11-23 DIAGNOSIS — G4733 Obstructive sleep apnea (adult) (pediatric): Secondary | ICD-10-CM | POA: Diagnosis not present
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
## 2022-12-18 ENCOUNTER — Ambulatory Visit (INDEPENDENT_AMBULATORY_CARE_PROVIDER_SITE_OTHER): Payer: 59 | Admitting: Physician Assistant

## 2022-12-18 ENCOUNTER — Encounter: Payer: Self-pay | Admitting: Physician Assistant

## 2022-12-18 VITALS — BP 106/68 | HR 86 | Ht 60.0 in | Wt 250.0 lb

## 2022-12-18 DIAGNOSIS — Z7985 Long-term (current) use of injectable non-insulin antidiabetic drugs: Secondary | ICD-10-CM | POA: Diagnosis not present

## 2022-12-18 DIAGNOSIS — F419 Anxiety disorder, unspecified: Secondary | ICD-10-CM | POA: Diagnosis not present

## 2022-12-18 DIAGNOSIS — E0590 Thyrotoxicosis, unspecified without thyrotoxic crisis or storm: Secondary | ICD-10-CM | POA: Diagnosis not present

## 2022-12-18 DIAGNOSIS — E1165 Type 2 diabetes mellitus with hyperglycemia: Secondary | ICD-10-CM

## 2022-12-18 LAB — POCT GLYCOSYLATED HEMOGLOBIN (HGB A1C): Hemoglobin A1C: 5.6 % (ref 4.0–5.6)

## 2022-12-18 MED ORDER — SEMAGLUTIDE (1 MG/DOSE) 4 MG/3ML ~~LOC~~ SOPN: 1.0000 mg | PEN_INJECTOR | SUBCUTANEOUS | 0 refills | Status: DC

## 2022-12-18 MED ORDER — OZEMPIC (0.25 OR 0.5 MG/DOSE) 2 MG/3ML ~~LOC~~ SOPN: 0.5000 mg | PEN_INJECTOR | SUBCUTANEOUS | 0 refills | Status: DC

## 2022-12-18 NOTE — Patient Instructions (Signed)
It was great to see you!  Let's increase Ozempic to 0.5 mg weekly x 1 month Then, you may increase Ozempic to 1 mg weekly  Message me if you'd like to continue to increase quickly and I will send in next doses  Let's follow-up in 6 months, sooner if you have concerns.  Take care,  Jarold Motto PA-C

## 2022-12-19 ENCOUNTER — Ambulatory Visit: Payer: Self-pay | Admitting: Surgery

## 2022-12-19 DIAGNOSIS — E0590 Thyrotoxicosis, unspecified without thyrotoxic crisis or storm: Secondary | ICD-10-CM | POA: Diagnosis not present

## 2023-01-06 ENCOUNTER — Other Ambulatory Visit: Payer: 59

## 2023-01-14 NOTE — Progress Notes (Addendum)
COVID Vaccine received:  []  No [x]  Yes Date of any COVID positive Test in last 90 days: No PCP - Marni Griffon Cardiologist - no  Chest x-ray - no EKG 02/19/22  Epic-   Stress Test - no ECHO - no Cardiac Cath - no  Bowel Prep - [x]  No  []   Yes ______  Pacemaker / ICD device [x]  No []  Yes   Spinal Cord Stimulator:[x]  No []  Yes       History of Sleep Apnea? []  No [x]  Yes   CPAP used?- [x]  No []  Yes    Does the patient monitor blood sugar?          []  No [x]  Yes  []  N/A  Patient has: []  NO Hx DM   []  Pre-DM                 []  DM1  [x]   DM2 Does patient have a [employer] or Dexacom? []  No []  Yes   Fasting Blood Sugar Ranges- 100 Checks Blood Sugar _1____ times a week  GLP1 agonist / usual dose - OZempic to stop 01/18/23 GLP1 instructions:  SGLT-2 inhibitors / usual dose -  SGLT-2 instructions:   Blood Thinner / Instructions:no Aspirin Instructions:no  Comments:   Activity level: Patient is able to climb a flight of stairs without difficulty; [x]  No CP  [x]  No SOB, but would have ___   Patient can  perform ADLs without assistance.   Anesthesia review:   Patient denies shortness of breath, fever, cough and chest pain at PAT appointment.  Patient verbalized understanding and agreement to the Pre-Surgical Instructions that were given to them at this PAT appointment. Patient was also educated of the need to review these PAT instructions again prior to his/her surgery.I reviewed the appropriate phone numbers to call if they have any and questions or concerns.

## 2023-01-14 NOTE — Patient Instructions (Addendum)
SURGICAL WAITING ROOM VISITATION  Patients having surgery or a procedure may have no more than 2 support people in the waiting area - these visitors may rotate.    Children under the age of 17 must have an adult with them who is not the patient.  Due to an increase in RSV and influenza rates and associated hospitalizations, children ages 62 and under may not visit patients in [facility] hospitals.  If the patient needs to stay at the hospital during part of their recovery, the visitor guidelines for inpatient rooms apply. Pre-op nurse will coordinate an appropriate time for 1 support person to accompany patient in pre-op.  This support person may not rotate.    Please refer to the [facility] website for the visitor guidelines for Inpatients (after your surgery is over and you are in a regular room).       Your procedure is scheduled on: 01/30/23   Report to [facility] Main Entrance    Report to admitting at 7:15 AM   Call this number if you have problems the morning of surgery [phone]   Do not eat food :After Midnight.   After Midnight you may have the following liquids until 6:30 AM DAY OF SURGERY  Water Non-Citrus Juices (without pulp, NO RED-Apple, White grape, White cranberry) Black Coffee (NO MILK/CREAM OR CREAMERS, sugar ok)  Clear Tea (NO MILK/CREAM OR CREAMERS, sugar ok) regular and decaf                             Plain Jell-O (NO RED)                                           Fruit ices (not with fruit pulp, NO RED)                                     Popsicles (NO RED)                                                               Sports drinks like Gatorade (NO RED)                   Oral Hygiene is also important to reduce your risk of infection.                                    Remember - BRUSH YOUR TEETH THE MORNING OF SURGERY WITH YOUR REGULAR TOOTHPASTE   Do NOT smoke after Midnight   Take these medicines the morning of  surgery:Buspirone(Buspar), Methimazole(Tapazole), Ondansetron(Zofran)  DO NOT TAKE ANY ORAL DIABETIC MEDICATIONS DAY OF YOUR SURGERY             You may not have any metal on your body including hair pins, jewelry, and body piercing             Do not wear make-up, lotions, powders, perfumes or deodorant  Do not wear nail polish including gel  and S&S, artificial/acrylic nails, or any other type of covering on natural nails including finger and toenails. If you have artificial nails, gel coating, etc. that needs to be removed by a nail salon please have this removed prior to surgery or surgery may need to be canceled/ delayed if the surgeon/ anesthesia feels like they are unable to be safely monitored.   Do not shave  48 hours prior to surgery.    Do not bring valuables to the hospital. [facility] IS NOT             RESPONSIBLE   FOR VALUABLES.   Contacts, glasses, dentures or bridgework may not be worn into surgery.   Bring small overnight bag day of surgery.   DO NOT BRING YOUR HOME MEDICATIONS TO THE HOSPITAL. PHARMACY WILL DISPENSE MEDICATIONS LISTED ON YOUR MEDICATION LIST TO YOU DURING YOUR ADMISSION IN THE HOSPITAL!    Patients discharged on the day of surgery will not be allowed to drive home.  Someone NEEDS to stay with you for the first 24 hours after anesthesia.   Special Instructions: Bring a copy of your healthcare power of attorney and living will documents the day of surgery if you haven't scanned them before.              Please read over the following fact sheets you were given: IF YOU HAVE QUESTIONS ABOUT YOUR PRE-OP INSTRUCTIONS PLEASE CALL [phone] Heidi Ellison   If you received a COVID test during your pre-op visit  it is requested that you wear a mask when out in public, stay away from anyone that may not be feeling well and notify your surgeon if you develop symptoms. If you test positive for Covid or have been in contact with anyone that has tested positive in the  last 10 days please notify you surgeon.    Heidi Ellison - Preparing for Surgery Before surgery, you can play an important role.  Because skin is not sterile, your skin needs to be as free of germs as possible.  You can reduce the number of germs on your skin by washing with CHG (chlorahexidine gluconate) soap before surgery.  CHG is an antiseptic cleaner which kills germs and bonds with the skin to continue killing germs even after washing. Please DO NOT use if you have an allergy to CHG or antibacterial soaps.  If your skin becomes reddened/irritated stop using the CHG and inform your nurse when you arrive at Short Stay. Do not shave (including legs and underarms) for at least 48 hours prior to the first CHG shower.  You may shave your face/neck.  Please follow these instructions carefully:  1.  Shower with CHG Soap the night before surgery and the  morning of surgery.  2.  If you choose to wash your hair, wash your hair first as usual with your normal  shampoo.  3.  After you shampoo, rinse your hair and body thoroughly to remove the shampoo.                             4.  Use CHG as you would any other liquid soap.  You can apply chg directly to the skin and wash.  Gently with a scrungie or clean washcloth.  5.  Apply the CHG Soap to your body ONLY FROM THE NECK DOWN.   Do   not use on face/ open  Wound or open sores. Avoid contact with eyes, ears mouth and   genitals (private parts).                       Wash face,  Genitals (private parts) with your normal soap.             6.  Wash thoroughly, paying special attention to the area where your    surgery  will be performed.  7.  Thoroughly rinse your body with warm water from the neck down.  8.  DO NOT shower/wash with your normal soap after using and rinsing off the CHG Soap.                9.  Pat yourself dry with a clean towel.            10.  Wear clean pajamas.            11.  Place clean sheets on your bed the  night of your first shower and do not  sleep with pets. Day of Surgery : Do not apply any lotions/deodorants the morning of surgery.  Please wear clean clothes to the hospital/surgery center.  FAILURE TO FOLLOW THESE INSTRUCTIONS MAY RESULT IN THE CANCELLATION OF YOUR SURGERY  __How to Manage Your Diabetes Before and After Surgery   Why is it important to control my blood sugar before and after surgery? Improving blood sugar levels before and after surgery helps healing and can limit problems. A way of improving blood sugar control is eating a healthy diet by:  Eating less sugar and carbohydrates  Increasing activity/exercise  Talking with your doctor about reaching your blood sugar goals High blood sugars (greater than 180 mg/dL) can raise your risk of infections and slow your recovery, so you will need to focus on controlling your diabetes during the weeks before surgery. Make sure that the doctor who takes care of your diabetes knows about your planned surgery including the date and location.  How do I manage my blood sugar before surgery? Check your blood sugar at least 4 times a day, starting 2 days before surgery, to make sure that the level is not too high or low. Check your blood sugar the morning of your surgery when you wake up and every 2 hours until you get to the Short Stay unit. If your blood sugar is less than 70 mg/dL, you will need to treat for low blood sugar: Do not take insulin. Treat a low blood sugar (less than 70 mg/dL) with  cup of clear juice (cranberry or apple), 4 glucose tablets, OR glucose gel. Recheck blood sugar in 15 minutes after treatment (to make sure it is greater than 70 mg/dL). If your blood sugar is not greater than 70 mg/dL on recheck, call [phone] for further instructions. Report your blood sugar to the short stay nurse when you get to Short Stay.  If you are admitted to the hospital after surgery: Your blood sugar will be checked by the  staff and you will probably be given insulin after surgery (instead of oral diabetes medicines) to make sure you have good blood sugar levels. The goal for blood sugar control after surgery is 80-180 mg/dL.   WHAT DO I DO ABOUT MY DIABETES MEDICATION?  Do not take oral diabetes medicines (pills) the morning of surgery.  DO NOT TAKE THE FOLLOWING 7 DAYS PRIOR TO SURGERY: Ozempic, Wegovy, Rybelsus (Semaglutide), Byetta (exenatide), Bydureon (exenatide ER), Victoza,  Saxenda (liraglutide), or Trulicity (dulaglutide) Mounjaro (Tirzepatide) Adlyxin (Lixisenatide), Polyethylene Glycol Loxenatide.   Patient Signature:  Date:   Nurse Signature:  Date:   Reviewed and Endorsed by [facility] Patient Education Committee, August 2015

## 2023-01-16 ENCOUNTER — Encounter (HOSPITAL_COMMUNITY)
Admission: RE | Admit: 2023-01-16 | Discharge: 2023-01-16 | Disposition: A | Discharge status: Home / Self Care | Payer: 59 | Source: Ambulatory Visit | Attending: Surgery | Admitting: Surgery

## 2023-01-16 ENCOUNTER — Encounter (HOSPITAL_COMMUNITY): Payer: Self-pay

## 2023-01-16 ENCOUNTER — Other Ambulatory Visit: Payer: Self-pay

## 2023-01-16 ENCOUNTER — Other Ambulatory Visit: Payer: 59

## 2023-01-16 VITALS — BP 139/72 | HR 72 | Temp 98.3°F | Resp 18 | Ht 60.0 in | Wt 252.0 lb

## 2023-01-16 DIAGNOSIS — E119 Type 2 diabetes mellitus without complications: Secondary | ICD-10-CM | POA: Insufficient documentation

## 2023-01-16 DIAGNOSIS — Z01818 Encounter for other preprocedural examination: Secondary | ICD-10-CM

## 2023-01-16 DIAGNOSIS — Z01812 Encounter for preprocedural laboratory examination: Secondary | ICD-10-CM | POA: Diagnosis not present

## 2023-01-16 HISTORY — DX: Depression, unspecified: F32.A

## 2023-01-16 HISTORY — DX: Hypothyroidism, unspecified: E03.9

## 2023-01-16 LAB — GLUCOSE, CAPILLARY: Glucose-Capillary: 76 mg/dL (ref 70–99)

## 2023-01-16 LAB — CBC
HCT: 46.4 % — ABNORMAL HIGH (ref 36.0–46.0)
Hemoglobin: 15.4 g/dL — ABNORMAL HIGH (ref 12.0–15.0)
MCH: 30.7 pg (ref 26.0–34.0)
MCHC: 33.2 g/dL (ref 30.0–36.0)
MCV: 92.6 fL (ref 80.0–100.0)
Platelets: 305 10*3/uL (ref 150–400)
RBC: 5.01 MIL/uL (ref 3.87–5.11)
RDW: 13.7 % (ref 11.5–15.5)
WBC: 8.1 10*3/uL (ref 4.0–10.5)
nRBC: 0 % (ref 0.0–0.2)

## 2023-01-16 LAB — BASIC METABOLIC PANEL
Anion gap: 9 (ref 5–15)
BUN: 15 mg/dL (ref 6–20)
CO2: 24 mmol/L (ref 22–32)
Calcium: 9.3 mg/dL (ref 8.9–10.3)
Chloride: 104 mmol/L (ref 98–111)
Creatinine, Ser: 0.59 mg/dL (ref 0.44–1.00)
GFR, Estimated: >60 mL/min (ref >60)
Glucose, Bld: 83 mg/dL (ref 70–99)
Potassium: 4.2 mmol/L (ref 3.5–5.1)
Sodium: 137 mmol/L (ref 135–145)

## 2023-01-26 ENCOUNTER — Encounter (HOSPITAL_COMMUNITY): Payer: Self-pay | Admitting: Surgery

## 2023-01-26 NOTE — H&P (Signed)
REFERRING PHYSICIAN: Shamleffer, Ibtehal  PROVIDER: Jeremey Bascom Myra Rude, MD   Chief Complaint: New Consultation (Hyperthyroidism)  History of Present Illness:  Patient is referred by Dr. Lesly Dukes for surgical evaluation and management of hyperthyroidism. Patient was initially diagnosed with hyperthyroidism approximately 1-1/2 years ago. Patient was having issues with hyperhidrosis. She also has type 2 diabetes. Evaluation demonstrated evidence of hyperthyroidism with a suppressed TSH level that was undetectable. Patient has been started on methimazole. She is now taking methimazole twice daily. T3 and T4 levels are back within the normal range. TSH remains suppressed at 0.02. Patient denies tremors. She denies palpitations. She has had no prior head or neck surgery. She had no prior history of thyroid disease. Patient has not had any recent imaging but did have a CT scan of the neck in 2019 showing essentially a normal-sized thyroid gland. Patient presents today to discuss thyroid surgery. 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  Review of Systems: A complete review of systems was obtained from the patient. I have reviewed this information and discussed as appropriate with the patient. See HPI as well for other ROS.  Review of Systems  Constitutional: Positive for diaphoresis.  HENT: Negative.  Eyes: Negative.  Respiratory: Negative.  Cardiovascular: Negative.  Gastrointestinal: Negative.  Genitourinary: Negative.  Musculoskeletal: Negative.  Skin: Negative.  Endo/Heme/Allergies: Negative.  Psychiatric/Behavioral: Negative.    Medical History: Past Medical History:  Diagnosis Date  Anxiety  Arthritis  Diabetes mellitus without complication (CMS/HHS-HCC)  Sleep apnea  Thyroid  disease   Patient Active Problem List  Diagnosis  Hyperthyroidism   Past Surgical History:  Procedure Laterality Date  ANKLE SURGERY Right 09/2021    No Known Allergies  Current Outpatient Medications on File Prior to Visit  Medication Sig Dispense Refill  methIMAzole (TAPAZOLE) 5 MG tablet Take 1 tablet by mouth 2 (two) times daily  [START ON 01/17/2023] semaglutide (OZEMPIC) 1 mg/dose (4 mg/3 mL) pen injector Inject 1 mg as directed once a week.   No current facility-administered medications on file prior to visit.   History reviewed. No pertinent family history.   Social History   Tobacco Use  Smoking Status Former  Types: Cigarettes  Smokeless Tobacco Never    Social History   Socioeconomic History  Marital status: Single  Tobacco Use  Smoking status: Former  Types: Cigarettes  Smokeless tobacco: Never  Substance and Sexual Activity  Alcohol use: Yes  Drug use: Never   Social Determinants of Health   Received from [employer]  Social Network   Objective:   Vitals:  12/19/22 1621 12/19/22 1626  BP: 119/78  Pulse: 93  Temp: 36.4 C (97.6 F)  SpO2: 98%  Weight: (!) 113.9 kg (251 lb)  Height: 152.4 cm (5')  PainSc: 0-No pain  PainLoc: Neck   Body mass index is 49.02 kg/m.  Physical Exam   GENERAL APPEARANCE Comfortable, no acute issues Development: normal Gross deformities: none  SKIN Rash, lesions, ulcers: none Induration, erythema: none Nodules: none palpable  EYES Conjunctiva and lids: normal Pupils: equal and reactive  EARS, NOSE, MOUTH, THROAT External ears: no lesion or deformity External nose: no lesion or deformity Hearing: grossly normal  NECK Symmetric: yes Trachea: midline Thyroid: no palpable nodules in the thyroid bed  ABDOMEN Not assessed  GENITOURINARY/RECTAL Not assessed  MUSCULOSKELETAL  Station and gait: normal Digits and nails: no clubbing or cyanosis Muscle strength: grossly normal all  extremities Range of motion: grossly normal all extremities Deformity: none  LYMPHATIC Cervical: none palpable Supraclavicular: none palpable  PSYCHIATRIC Oriented to person, place, and time: yes Mood and affect: normal for situation Judgment and insight: appropriate for situation   Assessment and Plan:   Hyperthyroidism  Patient is referred by Dr. Lesly Dukes for surgical evaluation and management of hyperthyroidism.  Patient provided with a copy of "The Thyroid Book: Medical and Surgical Treatment of Thyroid Problems", published by [employer], 16 pages. Book reviewed and explained to patient during visit today.  Today we reviewed her clinical history. We discussed options for management of hyperthyroidism. We reviewed her laboratory studies. 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 We discussed total thyroidectomy. We discussed the size and location of the surgical incision. We discussed the risk and benefits of surgery including the risk of recurrent laryngeal nerve injury and injury to parathyroid glands. We discussed the hospital stay to be anticipated. We discussed her postoperative recovery and returned to work and activities. We discussed the need for lifelong thyroid hormone replacement therapy. The patient understands and wishes to proceed with surgery in the near future.  Darnell Level, MD [facility] Surgery A [facility] practice Office: [phone]

## 2023-01-30 ENCOUNTER — Other Ambulatory Visit: Payer: Self-pay

## 2023-01-30 ENCOUNTER — Ambulatory Visit (HOSPITAL_COMMUNITY): Payer: Medicaid Other | Admitting: Anesthesiology

## 2023-01-30 ENCOUNTER — Encounter (HOSPITAL_COMMUNITY)
Admission: RE | Disposition: A | Discharge status: Home / Self Care | Payer: Self-pay | Source: Home / Self Care | Attending: Surgery

## 2023-01-30 ENCOUNTER — Ambulatory Visit (HOSPITAL_COMMUNITY)
Admission: RE | Admit: 2023-01-30 | Discharge: 2023-01-31 | Disposition: A | Discharge status: Home / Self Care | Payer: Medicaid Other | Attending: Surgery | Admitting: Surgery

## 2023-01-30 ENCOUNTER — Ambulatory Visit (HOSPITAL_BASED_OUTPATIENT_CLINIC_OR_DEPARTMENT_OTHER): Payer: Medicaid Other | Admitting: Anesthesiology

## 2023-01-30 ENCOUNTER — Encounter (HOSPITAL_COMMUNITY): Payer: Self-pay | Admitting: Surgery

## 2023-01-30 DIAGNOSIS — E063 Autoimmune thyroiditis: Secondary | ICD-10-CM | POA: Diagnosis not present

## 2023-01-30 DIAGNOSIS — Z6841 Body Mass Index (BMI) 40.0 and over, adult: Secondary | ICD-10-CM | POA: Diagnosis not present

## 2023-01-30 DIAGNOSIS — Z79899 Other long term (current) drug therapy: Secondary | ICD-10-CM | POA: Diagnosis not present

## 2023-01-30 DIAGNOSIS — Z87891 Personal history of nicotine dependence: Secondary | ICD-10-CM | POA: Diagnosis not present

## 2023-01-30 DIAGNOSIS — E0590 Thyrotoxicosis, unspecified without thyrotoxic crisis or storm: Secondary | ICD-10-CM | POA: Diagnosis present

## 2023-01-30 DIAGNOSIS — E0520 Thyrotoxicosis with toxic multinodular goiter without thyrotoxic crisis or storm: Secondary | ICD-10-CM | POA: Diagnosis not present

## 2023-01-30 DIAGNOSIS — E119 Type 2 diabetes mellitus without complications: Secondary | ICD-10-CM | POA: Diagnosis not present

## 2023-01-30 DIAGNOSIS — G4730 Sleep apnea, unspecified: Secondary | ICD-10-CM | POA: Insufficient documentation

## 2023-01-30 DIAGNOSIS — E1165 Type 2 diabetes mellitus with hyperglycemia: Secondary | ICD-10-CM

## 2023-01-30 HISTORY — PX: THYROIDECTOMY: SHX17

## 2023-01-30 LAB — GLUCOSE, CAPILLARY
Glucose-Capillary: 115 mg/dL — ABNORMAL HIGH (ref 70–99)
Glucose-Capillary: 144 mg/dL — ABNORMAL HIGH (ref 70–99)
Glucose-Capillary: 192 mg/dL — ABNORMAL HIGH (ref 70–99)
Glucose-Capillary: 230 mg/dL — ABNORMAL HIGH (ref 70–99)

## 2023-01-30 LAB — POCT PREGNANCY, URINE: Preg Test, Ur: NEGATIVE

## 2023-01-30 SURGERY — THYROIDECTOMY
Anesthesia: General

## 2023-01-30 MED ORDER — OXYCODONE HCL 5 MG PO TABS
5.0000 mg | ORAL_TABLET | ORAL | Status: DC | PRN
  Administered 2023-01-30 – 2023-01-31 (×4): 10 mg via ORAL
  Filled 2023-01-30 (×4): qty 2

## 2023-01-30 MED ORDER — ONDANSETRON HCL 4 MG/2ML IJ SOLN: 4.0000 mg | Freq: Four times a day (QID) | INTRAMUSCULAR | Status: DC | PRN

## 2023-01-30 MED ORDER — HYDRALAZINE HCL 20 MG/ML IJ SOLN
5.0000 mg | Freq: Once | INTRAMUSCULAR | Status: AC
  Administered 2023-01-30: 5 mg via INTRAVENOUS

## 2023-01-30 MED ORDER — ACETAMINOPHEN 325 MG PO TABS: 650.0000 mg | ORAL_TABLET | Freq: Four times a day (QID) | ORAL | Status: DC | PRN

## 2023-01-30 MED ORDER — PROPOFOL 10 MG/ML IV BOLUS
INTRAVENOUS | Status: DC | PRN
Start: 2023-01-30 — End: 2023-01-30
  Administered 2023-01-30 (×2): 50 mg via INTRAVENOUS
  Administered 2023-01-30: 200 mg via INTRAVENOUS

## 2023-01-30 MED ORDER — FENTANYL CITRATE (PF) 100 MCG/2ML IJ SOLN
INTRAMUSCULAR | Status: AC
  Filled 2023-01-30: qty 2

## 2023-01-30 MED ORDER — HYDRALAZINE HCL 20 MG/ML IJ SOLN: 5.0000 mg | Freq: Once | INTRAMUSCULAR | Status: AC

## 2023-01-30 MED ORDER — ROCURONIUM BROMIDE 10 MG/ML (PF) SYRINGE
PREFILLED_SYRINGE | INTRAVENOUS | Status: AC
  Filled 2023-01-30: qty 10

## 2023-01-30 MED ORDER — SUGAMMADEX SODIUM 200 MG/2ML IV SOLN
INTRAVENOUS | Status: DC | PRN
  Administered 2023-01-30: 200 mg via INTRAVENOUS

## 2023-01-30 MED ORDER — KETAMINE HCL 50 MG/5ML IJ SOSY
PREFILLED_SYRINGE | INTRAMUSCULAR | Status: AC
  Filled 2023-01-30: qty 5

## 2023-01-30 MED ORDER — HYDROMORPHONE HCL 1 MG/ML IJ SOLN: 1.0000 mg | INTRAMUSCULAR | Status: DC | PRN

## 2023-01-30 MED ORDER — PROPOFOL 10 MG/ML IV BOLUS
INTRAVENOUS | Status: AC
  Filled 2023-01-30: qty 20

## 2023-01-30 MED ORDER — TRAMADOL HCL 50 MG PO TABS: 50.0000 mg | ORAL_TABLET | Freq: Four times a day (QID) | ORAL | Status: DC | PRN

## 2023-01-30 MED ORDER — LACTATED RINGERS IV SOLN: INTRAVENOUS | Status: DC

## 2023-01-30 MED ORDER — ORAL CARE MOUTH RINSE: 15.0000 mL | Freq: Once | OROMUCOSAL | Status: AC

## 2023-01-30 MED ORDER — ONDANSETRON 4 MG PO TBDP: 4.0000 mg | ORAL_TABLET | Freq: Four times a day (QID) | ORAL | Status: DC | PRN

## 2023-01-30 MED ORDER — CHLORHEXIDINE GLUCONATE CLOTH 2 % EX PADS: 6.0000 | MEDICATED_PAD | Freq: Once | CUTANEOUS | Status: DC

## 2023-01-30 MED ORDER — HYDRALAZINE HCL 20 MG/ML IJ SOLN
INTRAMUSCULAR | Status: AC
  Administered 2023-01-30: 5 mg via INTRAVENOUS
  Filled 2023-01-30: qty 1

## 2023-01-30 MED ORDER — HYDROMORPHONE HCL 1 MG/ML IJ SOLN
INTRAMUSCULAR | Status: AC
  Administered 2023-01-30: 0.25 mg via INTRAVENOUS
  Filled 2023-01-30: qty 1

## 2023-01-30 MED ORDER — CHLORHEXIDINE GLUCONATE 0.12 % MT SOLN
15.0000 mL | Freq: Once | OROMUCOSAL | Status: AC
  Administered 2023-01-30: 15 mL via OROMUCOSAL

## 2023-01-30 MED ORDER — LIDOCAINE HCL (CARDIAC) PF 100 MG/5ML IV SOSY
PREFILLED_SYRINGE | INTRAVENOUS | Status: DC | PRN
  Administered 2023-01-30: 60 mg via INTRAVENOUS

## 2023-01-30 MED ORDER — HYDROMORPHONE HCL 1 MG/ML IJ SOLN
0.2500 mg | INTRAMUSCULAR | Status: DC | PRN
  Administered 2023-01-30: 0.5 mg via INTRAVENOUS
  Administered 2023-01-30: 0.25 mg via INTRAVENOUS

## 2023-01-30 MED ORDER — CEFAZOLIN SODIUM-DEXTROSE 2-4 GM/100ML-% IV SOLN
2.0000 g | INTRAVENOUS | Status: AC
  Administered 2023-01-30: 2 g via INTRAVENOUS
  Filled 2023-01-30: qty 100

## 2023-01-30 MED ORDER — ACETAMINOPHEN 650 MG RE SUPP: 650.0000 mg | Freq: Four times a day (QID) | RECTAL | Status: DC | PRN

## 2023-01-30 MED ORDER — 0.9 % SODIUM CHLORIDE (POUR BTL) OPTIME
TOPICAL | Status: DC | PRN
  Administered 2023-01-30: 1000 mL

## 2023-01-30 MED ORDER — SODIUM CHLORIDE 0.45 % IV SOLN: INTRAVENOUS | Status: DC

## 2023-01-30 MED ORDER — HEMOSTATIC AGENTS (NO CHARGE) OPTIME
TOPICAL | Status: DC | PRN
  Administered 2023-01-30: 1 via TOPICAL

## 2023-01-30 MED ORDER — INSULIN ASPART 100 UNIT/ML IJ SOLN
0.0000 [IU] | Freq: Three times a day (TID) | INTRAMUSCULAR | Status: DC
  Administered 2023-01-31: 2 [IU] via SUBCUTANEOUS

## 2023-01-30 MED ORDER — CALCIUM CARBONATE 1250 (500 CA) MG PO TABS
2.0000 | ORAL_TABLET | Freq: Three times a day (TID) | ORAL | Status: DC
  Administered 2023-01-30 – 2023-01-31 (×2): 2500 mg via ORAL
  Filled 2023-01-30 (×2): qty 2

## 2023-01-30 MED ORDER — DEXAMETHASONE SODIUM PHOSPHATE 10 MG/ML IJ SOLN
INTRAMUSCULAR | Status: AC
  Filled 2023-01-30: qty 1

## 2023-01-30 MED ORDER — FENTANYL CITRATE PF 50 MCG/ML IJ SOSY
25.0000 ug | PREFILLED_SYRINGE | INTRAMUSCULAR | Status: DC | PRN
  Administered 2023-01-30 (×2): 50 ug via INTRAVENOUS
  Administered 2023-01-30: 25 ug via INTRAVENOUS

## 2023-01-30 MED ORDER — MIDAZOLAM HCL 5 MG/5ML IJ SOLN
INTRAMUSCULAR | Status: DC | PRN
  Administered 2023-01-30: 2 mg via INTRAVENOUS

## 2023-01-30 MED ORDER — DEXAMETHASONE SODIUM PHOSPHATE 10 MG/ML IJ SOLN
INTRAMUSCULAR | Status: DC | PRN
  Administered 2023-01-30: 5 mg via INTRAVENOUS

## 2023-01-30 MED ORDER — ONDANSETRON HCL 4 MG/2ML IJ SOLN
INTRAMUSCULAR | Status: AC
  Filled 2023-01-30: qty 2

## 2023-01-30 MED ORDER — FENTANYL CITRATE PF 50 MCG/ML IJ SOSY
PREFILLED_SYRINGE | INTRAMUSCULAR | Status: AC
  Administered 2023-01-30: 25 ug via INTRAVENOUS
  Filled 2023-01-30: qty 3

## 2023-01-30 MED ORDER — ACETAMINOPHEN 500 MG PO TABS
1000.0000 mg | ORAL_TABLET | Freq: Once | ORAL | Status: AC
  Administered 2023-01-30: 1000 mg via ORAL
  Filled 2023-01-30: qty 2

## 2023-01-30 MED ORDER — ROCURONIUM BROMIDE 100 MG/10ML IV SOLN
INTRAVENOUS | Status: DC | PRN
  Administered 2023-01-30: 30 mg via INTRAVENOUS
  Administered 2023-01-30: 50 mg via INTRAVENOUS

## 2023-01-30 MED ORDER — MIDAZOLAM HCL 2 MG/2ML IJ SOLN
INTRAMUSCULAR | Status: AC
  Filled 2023-01-30: qty 2

## 2023-01-30 MED ORDER — FENTANYL CITRATE (PF) 100 MCG/2ML IJ SOLN
INTRAMUSCULAR | Status: DC | PRN
  Administered 2023-01-30 (×4): 50 ug via INTRAVENOUS

## 2023-01-30 MED ORDER — ONDANSETRON HCL 4 MG/2ML IJ SOLN
INTRAMUSCULAR | Status: DC | PRN
  Administered 2023-01-30: 4 mg via INTRAVENOUS

## 2023-01-30 MED ORDER — KETAMINE HCL 10 MG/ML IJ SOLN
INTRAMUSCULAR | Status: DC | PRN
  Administered 2023-01-30: 20 mg via INTRAVENOUS
  Administered 2023-01-30: 30 mg via INTRAVENOUS

## 2023-01-30 SURGICAL SUPPLY — 33 items
ADH SKN CLS APL DERMABOND .7 (GAUZE/BANDAGES/DRESSINGS) ×1
APL PRP STRL LF DISP 70% ISPRP (MISCELLANEOUS) ×1
ATTRACTOMAT 16X20 MAGNETIC DRP (DRAPES) ×1 IMPLANT
BAG COUNTER SPONGE SURGICOUNT (BAG) ×1 IMPLANT
BAG SPNG CNTER NS LX DISP (BAG) ×1
BLADE SURG 15 STRL LF DISP TIS (BLADE) ×1 IMPLANT
BLADE SURG 15 STRL SS (BLADE) ×1
CHLORAPREP W/TINT 26 (MISCELLANEOUS) ×1 IMPLANT
CLIP TI MEDIUM 6 (CLIP) ×2 IMPLANT
CLIP TI WIDE RED SMALL 6 (CLIP) ×2 IMPLANT
COVER SURGICAL LIGHT HANDLE (MISCELLANEOUS) ×1 IMPLANT
DERMABOND ADVANCED .7 DNX12 (GAUZE/BANDAGES/DRESSINGS) ×1 IMPLANT
DRAPE LAPAROTOMY T 98X78 PEDS (DRAPES) ×1 IMPLANT
DRAPE UTILITY XL STRL (DRAPES) ×1 IMPLANT
ELECT PENCIL ROCKER SW 15FT (MISCELLANEOUS) ×1 IMPLANT
ELECT REM PT RETURN 15FT ADLT (MISCELLANEOUS) ×1 IMPLANT
GAUZE 4X4 16PLY ~~LOC~~+RFID DBL (SPONGE) ×1 IMPLANT
GLOVE SURG ORTHO 8.0 STRL STRW (GLOVE) ×1 IMPLANT
GOWN STRL REUS W/ TWL XL LVL3 (GOWN DISPOSABLE) ×2 IMPLANT
GOWN STRL REUS W/TWL XL LVL3 (GOWN DISPOSABLE) ×2
HEMOSTAT SURGICEL 2X4 FIBR (HEMOSTASIS) ×1 IMPLANT
ILLUMINATOR WAVEGUIDE N/F (MISCELLANEOUS) ×1 IMPLANT
KIT BASIN OR (CUSTOM PROCEDURE TRAY) ×1 IMPLANT
KIT TURNOVER KIT A (KITS) IMPLANT
PACK BASIC VI WITH GOWN DISP (CUSTOM PROCEDURE TRAY) ×1 IMPLANT
SHEARS HARMONIC 9CM CVD (BLADE) ×1 IMPLANT
SUT MNCRL AB 4-0 PS2 18 (SUTURE) ×1 IMPLANT
SUT SILK 3 0 SH 30 (SUTURE) ×1 IMPLANT
SUT VIC AB 3-0 SH 18 (SUTURE) ×2 IMPLANT
SYR BULB IRRIG 60ML STRL (SYRINGE) ×1 IMPLANT
TOWEL OR 17X26 10 PK STRL BLUE (TOWEL DISPOSABLE) ×1 IMPLANT
TOWEL OR NON WOVEN STRL DISP B (DISPOSABLE) ×1 IMPLANT
TUBING CONNECTING 10 (TUBING) ×1 IMPLANT

## 2023-01-30 NOTE — Anesthesia Postprocedure Evaluation (Signed)
Anesthesia Post Note  Patient: Heidi Ellison  Procedure(s) Performed: TOTAL THYROIDECTOMY     Patient location during evaluation: PACU Anesthesia Type: General Level of consciousness: awake and alert Pain management: pain level controlled Vital Signs Assessment: post-procedure vital signs reviewed and stable Respiratory status: spontaneous breathing, nonlabored ventilation, respiratory function stable and patient connected to nasal cannula oxygen Cardiovascular status: blood pressure returned to baseline and stable Postop Assessment: no apparent nausea or vomiting Anesthetic complications: no  No notable events documented.  Last Vitals:  Vitals:   01/30/23 1345 01/30/23 1358  BP: (!) 158/90 (!) 168/99  Pulse: 73 77  Resp: 19 18  Temp: 36.7 C 36.9 C  SpO2: 93% 100%    Last Pain:  Vitals:   01/30/23 1358  TempSrc: Oral  PainSc:                  Shaniya Tashiro L Janaiyah Blackard

## 2023-01-30 NOTE — Transfer of Care (Signed)
Immediate Anesthesia Transfer of Care Note  Patient: Heidi Ellison  Procedure(s) Performed: TOTAL THYROIDECTOMY  Patient Location: PACU  Anesthesia Type:General  Level of Consciousness: awake, alert , oriented, and patient cooperative  Airway & Oxygen Therapy: Patient Spontanous Breathing and Patient connected to face mask oxygen  Post-op Assessment: Report given to RN, Post -op Vital signs reviewed and stable, and Patient moving all extremities  Post vital signs: Reviewed and stable  Last Vitals:  Vitals Value Taken Time  BP 176/96 01/30/23 1136  Temp    Pulse 95 01/30/23 1139  Resp 31 01/30/23 1139  SpO2 100 % 01/30/23 1139  Vitals shown include unfiled device data.  Last Pain:  Vitals:   01/30/23 0846  TempSrc:   PainSc: 0-No pain         Complications: No notable events documented.

## 2023-01-30 NOTE — Anesthesia Preprocedure Evaluation (Addendum)
Anesthesia Evaluation  Patient identified by MRN, date of birth, ID band Patient awake    Reviewed: Allergy & Precautions, NPO status , Patient's Chart, lab work & pertinent test results  Airway Mallampati: I  TM Distance: >3 FB Neck ROM: Full    Dental no notable dental hx. (+) Teeth Intact, Dental Advisory Given   Pulmonary sleep apnea (does not wear CPAP) , Patient abstained from smoking., former smoker   Pulmonary exam normal breath sounds clear to auscultation       Cardiovascular negative cardio ROS Normal cardiovascular exam Rhythm:Regular Rate:Normal     Neuro/Psych  Headaches PSYCHIATRIC DISORDERS Anxiety Depression       GI/Hepatic negative GI ROS, Neg liver ROS,,,  Endo/Other  diabetes, Type 2Hypothyroidism  Morbid obesity (BMI 49)Graves disease  Renal/GU negative Renal ROS  negative genitourinary   Musculoskeletal  (+) Arthritis ,    Abdominal   Peds  Hematology negative hematology ROS (+)   Anesthesia Other Findings   Reproductive/Obstetrics                             Anesthesia Physical Anesthesia Plan  ASA: 3  Anesthesia Plan: General   Post-op Pain Management: Tylenol PO (pre-op)*   Induction: Intravenous  PONV Risk Score and Plan: 3 and Midazolam, Dexamethasone and Ondansetron  Airway Management Planned: Oral ETT  Additional Equipment:   Intra-op Plan:   Post-operative Plan: Extubation in OR  Informed Consent: I have reviewed the patients History and Physical, chart, labs and discussed the procedure including the risks, benefits and alternatives for the proposed anesthesia with the patient or authorized representative who has indicated his/her understanding and acceptance.     Dental advisory given  Plan Discussed with: CRNA  Anesthesia Plan Comments:        Anesthesia Quick Evaluation

## 2023-01-30 NOTE — Plan of Care (Signed)
  Problem: Fluid Volume: Goal: Ability to maintain a balanced intake and output will improve Outcome: Progressing   Problem: Nutritional: Goal: Maintenance of adequate nutrition will improve Outcome: Progressing   Problem: Education: Goal: Knowledge of General Education information will improve Description: Including pain rating scale, medication(s)/side effects and non-pharmacologic comfort measures Outcome: Progressing

## 2023-01-30 NOTE — Anesthesia Procedure Notes (Signed)
Procedure Name: Intubation Date/Time: 01/30/2023 9:49 AM  Performed by: Johnette Abraham, CRNAPre-anesthesia Checklist: Patient identified, Emergency Drugs available, Suction available and Patient being monitored Patient Re-evaluated:Patient Re-evaluated prior to induction Oxygen Delivery Method: Circle System Utilized Preoxygenation: Pre-oxygenation with 100% oxygen Induction Type: IV induction Ventilation: Mask ventilation without difficulty Laryngoscope Size: Glidescope and 3 Grade View: Grade I Tube type: Oral Tube size: 7.0 mm Number of attempts: 1 Airway Equipment and Method: Stylet and Oral airway Placement Confirmation: ETT inserted through vocal cords under direct vision, positive ETCO2 and breath sounds checked- equal and bilateral Secured at: 22 cm Tube secured with: Tape Dental Injury: Teeth and Oropharynx as per pre-operative assessment

## 2023-01-30 NOTE — Interval H&P Note (Signed)
History and Physical Interval Note:  01/30/2023 9:16 AM  Heidi Ellison  has presented today for surgery, with the diagnosis of hyper thyroidisim.  The various methods of treatment have been discussed with the patient and family. After consideration of risks, benefits and other options for treatment, the patient has consented to    Procedure(s) with comments: TOTAL THYROIDECTOMY (N/A) - 2ND SCRUB PERSON as a surgical intervention.    The patient's history has been reviewed, patient examined, no change in status, stable for surgery.  I have reviewed the patient's chart and labs.  Questions were answered to the patient's satisfaction.    Darnell Level, MD [facility] Surgery [facility] practice Office: [phone]   Darnell Level

## 2023-01-30 NOTE — Op Note (Signed)
Procedure Note  Pre-operative Diagnosis:  hyperthyroidism   Post-operative Diagnosis:  same  Surgeon:  Darnell Level, MD  Assistant:  none   Procedure:  Total thyroidectomy  Anesthesia:  General  Estimated Blood Loss:  15 cc  Drains: none         Specimen: thyroid to pathology  Indications:  Patient is referred by Dr. Lesly Dukes for surgical evaluation and management of hyperthyroidism. Patient was initially diagnosed with hyperthyroidism approximately 1-1/2 years ago. Patient was having issues with hyperhidrosis. She also has type 2 diabetes. Evaluation demonstrated evidence of hyperthyroidism with a suppressed TSH level that was undetectable. Patient has been started on methimazole. She is now taking methimazole twice daily. T3 and T4 levels are back within the normal range. TSH remains suppressed at 0.02. Patient denies tremors. She denies palpitations. She has had no prior head or neck surgery. She had no prior history of thyroid disease. Patient has not had any recent imaging but did have a CT scan of the neck in 2019 showing essentially a normal-sized thyroid gland. Patient presents today to discuss thyroid surgery. 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   Procedure Details: Procedure was done in OR #1 at the [facility]. The patient was brought to the operating room and placed in a supine position on the operating room table. Following administration of general anesthesia, the patient was positioned and then prepped and draped in the usual aseptic fashion. After ascertaining that an adequate level of anesthesia had been achieved, a small Kocher incision was made with #15 blade. Dissection was carried through subcutaneous tissues and platysma.Hemostasis was achieved with the electrocautery. Skin flaps were elevated cephalad and caudad  from the thyroid notch to the sternal notch. A Mahorner self-retaining retractor was placed for exposure. Strap muscles were incised in the midline and dissection was begun on the left side.  Strap muscles were reflected laterally.  Left thyroid lobe was normal in size without nodules.  The left lobe was gently mobilized with blunt dissection. Superior pole vessels were dissected out and divided individually between small and medium ligaclips with the harmonic scalpel. The thyroid lobe was rolled anteriorly. Branches of the inferior thyroid artery were divided between small ligaclips with the harmonic scalpel. Inferior venous tributaries were divided between ligaclips. Both the superior and inferior parathyroid glands were identified and preserved on their vascular pedicles. The recurrent laryngeal nerve was identified and preserved along its course. The ligament of Allyson Sabal was released with the electrocautery and the gland was mobilized onto the anterior trachea. Isthmus was mobilized across the midline. There was a small pyramidal lobe present which was resected with the isthums. Dry pack was placed in the left neck.  The right thyroid lobe was gently mobilized with blunt dissection. Right thyroid lobe was normal in size without nodules. Superior pole vessels were dissected out and divided between small and medium ligaclips with the Harmonic scalpel. Superior parathyroid was identified and preserved. Inferior venous tributaries were divided between medium ligaclips with the harmonic scalpel. The right thyroid lobe was rolled anteriorly and the branches of the inferior thyroid artery divided between small ligaclips. The right recurrent laryngeal nerve was identified and preserved along its course. The ligament of Allyson Sabal was released with the electrocautery. The right thyroid lobe was mobilized onto the anterior trachea and the remainder of the thyroid was dissected off the anterior  trachea and the thyroid was  completely excised. A suture was used to mark the left lobe. The entire thyroid gland was submitted to pathology for review.  The neck was irrigated with warm saline. Fibrillar was placed throughout the operative field. Strap muscles were approximated in the midline with interrupted 3-0 Vicryl sutures. Platysma was closed with interrupted 3-0 Vicryl sutures. Skin was closed with a running 4-0 Monocryl subcuticular suture. Wound was washed and Dermabond was applied. The patient was awakened from anesthesia and brought to the recovery room. The patient tolerated the procedure well.   Darnell Level, MD [facility] Office: [phone]

## 2023-01-31 ENCOUNTER — Telehealth: Payer: Self-pay | Admitting: Internal Medicine

## 2023-01-31 ENCOUNTER — Encounter (HOSPITAL_COMMUNITY): Payer: Self-pay | Admitting: Surgery

## 2023-01-31 DIAGNOSIS — E0520 Thyrotoxicosis with toxic multinodular goiter without thyrotoxic crisis or storm: Secondary | ICD-10-CM | POA: Diagnosis not present

## 2023-01-31 DIAGNOSIS — E890 Postprocedural hypothyroidism: Secondary | ICD-10-CM

## 2023-01-31 DIAGNOSIS — E0590 Thyrotoxicosis, unspecified without thyrotoxic crisis or storm: Secondary | ICD-10-CM

## 2023-01-31 LAB — GLUCOSE, CAPILLARY: Glucose-Capillary: 150 mg/dL — ABNORMAL HIGH (ref 70–99)

## 2023-01-31 MED ORDER — CALCIUM CARBONATE ANTACID 500 MG PO CHEW: 2.0000 | CHEWABLE_TABLET | Freq: Two times a day (BID) | ORAL | 1 refills | Status: DC

## 2023-01-31 MED ORDER — OXYCODONE HCL 5 MG PO TABS: 5.0000 mg | ORAL_TABLET | Freq: Four times a day (QID) | ORAL | 0 refills | Status: DC | PRN

## 2023-01-31 MED ORDER — LEVOTHYROXINE SODIUM 125 MCG PO TABS: 125.0000 ug | ORAL_TABLET | Freq: Every day | ORAL | 3 refills | Status: DC

## 2023-01-31 NOTE — Progress Notes (Signed)
Patient provided with discharge education, patient verbalized understanding. Iv removed.

## 2023-01-31 NOTE — Discharge Summary (Signed)
Physician Discharge Summary   Patient ID: Heidi Ellison MRN: [id number] DOB/AGE: 07-11-1990 32 y.o.  Admit date: 01/30/2023  Discharge date: 01/31/2023  Discharge Diagnoses:  Principal Problem:   Hyperthyroidism   Discharged Condition: good  Hospital Course: Patient was admitted for observation following thyroidectomy.  Post op course was uncomplicated.  Pain was well controlled.  Tolerated diet.  Post op calcium level on morning following surgery was 9.5 mg/dl.  Patient was prepared for discharge home on POD#1.  Consults: None  Treatments: surgery: total thyroidectomy  Discharge Exam: Blood pressure (!) 145/82, pulse 79, temperature 97.9 F (36.6 C), resp. rate 17, weight 114.3 kg, last menstrual period 03/01/2022, SpO2 96%. HEENT - clear Neck - wound dry and intact, Dermabond in place; mild STS; voice normal  Disposition: Home  Discharge Instructions     Diet - low sodium heart healthy   Complete by: As directed    Increase activity slowly   Complete by: As directed    No dressing needed   Complete by: As directed       Allergies as of 01/31/2023   No Known Allergies      Medication List     TAKE these medications    Accu-Chek Guide test strip Generic drug: glucose blood USE 1 STRIP TO CHECK GLUCOSE 4 TIMES DAILY AS DIRECTED   Accu-Chek Softclix Lancets lancets USE 1 LANCET TO CHECK GLUCOSE 4 TIMES DAILY AS DIRECTED   blood glucose meter kit and supplies Kit Dispense based on patient and insurance preference. Use up to four times daily as directed.   busPIRone 5 MG tablet Commonly known as: BUSPAR Take 1 tablet (5 mg total) by mouth 3 (three) times daily as needed.   calcium carbonate 500 MG chewable tablet Commonly known as: Tums Chew 2 tablets (400 mg of elemental calcium total) by mouth 2 (two) times daily.   FreeStyle Libre 2 Sensor Misc Apply to skin for 14 days   hydrOXYzine 25 MG tablet Commonly known as: ATARAX Take 1 tablet (25 mg  total) by mouth at bedtime as needed for anxiety (insomnia). Take one 25 mg tablet 30-60 minutes prior to bedtime for insomnia, anxiety. May increase to two tablets.   naproxen sodium 220 MG tablet Commonly known as: ALEVE Take 220 mg by mouth daily as needed (pain).   ondansetron 4 MG tablet Commonly known as: Zofran Take 1 tablet (4 mg total) by mouth every 8 (eight) hours as needed for nausea or vomiting.   oxyCODONE 5 MG immediate release tablet Commonly known as: Oxy IR/ROXICODONE Take 1 tablet (5 mg total) by mouth every 6 (six) hours as needed for moderate pain.   Ozempic (0.25 or 0.5 MG/DOSE) 2 MG/3ML Sopn Generic drug: Semaglutide(0.25 or 0.5MG /DOS) Inject 0.5 mg into the skin once a week.   Semaglutide (1 MG/DOSE) 4 MG/3ML Sopn Inject 1 mg as directed once a week.               Discharge Care Instructions  (From admission, onward)           Start     Ordered   01/31/23 0000  No dressing needed        01/31/23 0930            Follow-up Information     Darnell Level, MD. Schedule an appointment as soon as possible for a visit in 3 week(s).   Specialty: General Surgery Why: For wound re-check Contact information: [address] [place]  [address] [place] [address] [phone]                 Darnell Level, MD [facility] Office: [phone]   Signed: Darnell Level 01/31/2023, 9:30 AM

## 2023-01-31 NOTE — Discharge Instructions (Signed)
[facility] - Dr.    THYROID & PARATHYROID SURGERY:  POST-OP INSTRUCTIONS  Always review the instruction sheet provided by the hospital nurse at discharge.  A prescription for pain medication may be sent to your pharmacy at the time of discharge.  Take your pain medication as prescribed.  If narcotic pain medicine is not needed, then you may take acetaminophen (Tylenol) or ibuprofen (Advil) as needed for pain or soreness.  Take your normal home medications as prescribed unless otherwise directed.  If you need a refill on your pain medication, please contact the office during regular business hours.  Prescriptions will not be processed by the office after 5:00PM or on weekends.  Start with a light diet upon arrival home, such as soup and crackers or toast.  Be sure to drink plenty of fluids.  Resume your normal diet the day after surgery.  Most patients will experience some swelling and bruising on the chest and neck area.  Ice packs will help for the first 48 hours after arriving home.  Swelling and bruising will take several days to resolve.   It is common to experience some constipation after surgery.  Increasing fluid intake and taking a stool softener (Colace) will usually help to prevent this problem.  A mild laxative (Milk of Magnesia or Miralax) should be taken according to package directions if there has been no bowel movement after 48 hours.  Dermabond glue covers your incision. This seals the wound and you may shower at any time. The Dermabond will remain in place for about a week.  You may gradually remove the glue when it loosens around the edges.  If you need to loosen the Dermabond for removal, apply a layer of Vaseline to the wound for 15 minutes and then remove with a Kleenex. Your sutures are under the skin and will not show - they will dissolve on their own.  You may resume light daily activities beginning the day after discharge (such as self-care,  walking, climbing stairs), gradually increasing activities as tolerated. You may have sexual intercourse when it is comfortable. Refrain from any heavy lifting or straining until approved by your doctor. You may drive when you no longer are taking prescription pain medication, you can comfortably wear a seatbelt, and you can safely maneuver your car and apply the brakes.  You will see your doctor in the office for a follow-up appointment approximately three weeks after your surgery.  Make sure that you call for this appointment within a day or two after you arrive home to insure a convenient appointment time. Please have any requested laboratory tests performed a few days prior to your office visit so that the results will be available at your follow up appointment.  WHEN TO CALL THE [facility] OFFICE: -- Fever greater than 101.5 -- Inability to urinate -- Nausea and/or vomiting - persistent -- Extreme swelling or bruising -- Continued bleeding from incision -- Increased pain, redness, or drainage from the incision -- Difficulty swallowing or breathing -- Muscle cramping or spasms -- Numbness or tingling in hands or around lips  The clinic staff is available to answer your questions during regular business hours.  Please don't hesitate to call and ask to speak to one of the nurses if you have concerns.  [facility] OFFICE: [phone] (24 hours)  Please sign up for MyChart accounts. This will allow you to communicate directly with my nurse or myself without having to call the office. It will also allow you   to view your test results. You will need to enroll in MyChart for my office ([facility]) and for the hospital ([facility]).   , MD [facility] A [facility] practice 

## 2023-01-31 NOTE — Telephone Encounter (Signed)
The pt just had thyroid surgery   Please ask her to start Levothyroxine 125 mcg , 1 tablet a day this coming Monday 8/5th   Needs to taken every morning on an empty stomach , wait 30 minutes before eating breakfast   Needs to separate vitamins from levothyroxine by 4 hrs.    Please schedule her for lab appointment in 6 weeks    Thanks

## 2023-01-31 NOTE — Telephone Encounter (Signed)
Patient advised and lab scheduled

## 2023-02-02 NOTE — Progress Notes (Signed)
Pathology benign as expected.  tmg  Darnell Level, MD [facility] practice Office: [phone]

## 2023-02-13 DIAGNOSIS — E890 Postprocedural hypothyroidism: Secondary | ICD-10-CM | POA: Diagnosis not present

## 2023-02-13 DIAGNOSIS — E0590 Thyrotoxicosis, unspecified without thyrotoxic crisis or storm: Secondary | ICD-10-CM | POA: Diagnosis not present

## 2023-02-24 ENCOUNTER — Encounter: Payer: Self-pay | Admitting: Internal Medicine

## 2023-02-26 ENCOUNTER — Other Ambulatory Visit: Payer: Self-pay

## 2023-02-26 MED ORDER — LEVOTHYROXINE SODIUM 125 MCG PO TABS: 125.0000 ug | ORAL_TABLET | Freq: Every day | ORAL | 0 refills | Status: DC

## 2023-03-10 ENCOUNTER — Other Ambulatory Visit (INDEPENDENT_AMBULATORY_CARE_PROVIDER_SITE_OTHER): Payer: Medicaid Other

## 2023-03-10 ENCOUNTER — Other Ambulatory Visit: Payer: Self-pay | Admitting: Internal Medicine

## 2023-03-10 ENCOUNTER — Other Ambulatory Visit (HOSPITAL_COMMUNITY): Payer: Self-pay

## 2023-03-10 DIAGNOSIS — E890 Postprocedural hypothyroidism: Secondary | ICD-10-CM

## 2023-03-10 DIAGNOSIS — E0590 Thyrotoxicosis, unspecified without thyrotoxic crisis or storm: Secondary | ICD-10-CM

## 2023-03-10 LAB — T4, FREE: Free T4: 0.79 ng/dL (ref 0.60–1.60)

## 2023-03-10 LAB — TSH: TSH: 12.22 u[IU]/mL — ABNORMAL HIGH (ref 0.35–5.50)

## 2023-03-10 MED ORDER — LEVOTHYROXINE SODIUM 137 MCG PO TABS: 137.0000 ug | ORAL_TABLET | Freq: Every day | ORAL | 6 refills | Status: DC

## 2023-03-10 NOTE — Progress Notes (Signed)
Latest Reference Range & Units 03/10/23 08:58  TSH 0.35 - 5.50 uIU/mL 12.22 (H)  T4,Free(Direct) 0.60 - 1.60 ng/dL 4.09  (H): Data is abnormally high  Will increase levothyroxine 137 mcg daily

## 2023-03-20 ENCOUNTER — Other Ambulatory Visit: Payer: Self-pay | Admitting: Physician Assistant

## 2023-05-09 ENCOUNTER — Ambulatory Visit: Payer: 59 | Admitting: Internal Medicine
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
## 2023-06-26 ENCOUNTER — Other Ambulatory Visit: Payer: Self-pay | Admitting: Physician Assistant

## 2023-08-13 ENCOUNTER — Other Ambulatory Visit (HOSPITAL_COMMUNITY): Payer: Self-pay

## 2023-08-17 ENCOUNTER — Other Ambulatory Visit: Payer: Self-pay | Admitting: Physician Assistant

## 2023-09-01 DIAGNOSIS — R059 Cough, unspecified: Secondary | ICD-10-CM | POA: Diagnosis not present

## 2023-09-01 DIAGNOSIS — J069 Acute upper respiratory infection, unspecified: Secondary | ICD-10-CM | POA: Diagnosis not present

## 2023-09-03 ENCOUNTER — Ambulatory Visit: Admitting: Physician Assistant

## 2023-09-03 ENCOUNTER — Encounter: Payer: Self-pay | Admitting: Physician Assistant

## 2023-09-03 VITALS — BP 130/80 | HR 71 | Temp 97.3°F | Ht 60.0 in | Wt 265.2 lb

## 2023-09-03 DIAGNOSIS — R051 Acute cough: Secondary | ICD-10-CM | POA: Diagnosis not present
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
## 2023-09-03 NOTE — Patient Instructions (Signed)
 It was great to see you!  Continue current plan of care  Push fluids and rest  If you feel like you are not continuing to improve, please let me know   Let's follow-up in 1 month, sooner if you have concerns.  Take care,  Jarold Motto PA-C

## 2023-09-04 ENCOUNTER — Encounter: Payer: Self-pay | Admitting: Physician Assistant

## 2023-10-06 ENCOUNTER — Ambulatory Visit: Admitting: Physician Assistant

## 2023-10-10 ENCOUNTER — Encounter: Payer: Self-pay | Admitting: Physician Assistant

## 2023-10-10 ENCOUNTER — Ambulatory Visit: Admitting: Physician Assistant

## 2023-10-10 VITALS — BP 138/86 | HR 68 | Temp 97.5°F | Ht 60.0 in | Wt 268.5 lb

## 2023-10-10 DIAGNOSIS — E1165 Type 2 diabetes mellitus with hyperglycemia: Secondary | ICD-10-CM | POA: Diagnosis not present

## 2023-10-10 DIAGNOSIS — F419 Anxiety disorder, unspecified: Secondary | ICD-10-CM

## 2023-10-10 DIAGNOSIS — R200 Anesthesia of skin: Secondary | ICD-10-CM | POA: Diagnosis not present

## 2023-10-10 DIAGNOSIS — R202 Paresthesia of skin: Secondary | ICD-10-CM | POA: Diagnosis not present

## 2023-10-10 DIAGNOSIS — E0590 Thyrotoxicosis, unspecified without thyrotoxic crisis or storm: Secondary | ICD-10-CM

## 2023-10-10 DIAGNOSIS — Z7985 Long-term (current) use of injectable non-insulin antidiabetic drugs: Secondary | ICD-10-CM

## 2023-10-10 LAB — POCT GLYCOSYLATED HEMOGLOBIN (HGB A1C): Hemoglobin A1C: 6.2 % — AB (ref 4.0–5.6)

## 2023-10-10 MED ORDER — BUSPIRONE HCL 5 MG PO TABS: 5.0000 mg | ORAL_TABLET | Freq: Three times a day (TID) | ORAL | 1 refills | Status: AC | PRN

## 2023-10-10 MED ORDER — HYDROXYZINE HCL 25 MG PO TABS: 25.0000 mg | ORAL_TABLET | Freq: Every evening | ORAL | 0 refills | Status: AC | PRN

## 2023-10-10 MED ORDER — SEMAGLUTIDE (2 MG/DOSE) 8 MG/3ML ~~LOC~~ SOPN: 2.0000 mg | PEN_INJECTOR | SUBCUTANEOUS | 2 refills | Status: DC

## 2023-10-10 NOTE — Patient Instructions (Addendum)
 It was great to see you!  Increase Ozempic to 2 mg weekly  Voltaren Gel is available over the counter.   You are to apply this gel to your injured body part twice daily (morning and evening).   A little goes a long way so you can use about a pea-sized amount for each area.  Spread this small amount over the area into a thin film and let it dry.  Be sure that you do not rub the gel into your skin for more than 10 or 15 seconds otherwise it can irritate you skin.   Once you apply the gel, please do not put any other lotion or clothing in contact with that area for 30 minutes to allow the gel to absorb into your skin.   Some people are sensitive to the medication and can develop a sunburn-like rash.  If you have only mild symptoms it is okay to continue to use the medication but if you have any breakdown of your skin you should discontinue its use and please let us know.      Let's follow-up in 6 months for Comprehensive Physical Exam (CPE) preventive care annual visit, sooner if you have concerns.  Take care,  Jarold Motto PA-C
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
## 2023-10-11 LAB — COMPREHENSIVE METABOLIC PANEL WITH GFR
AG Ratio: 1.7 (calc) (ref 1.0–2.5)
ALT: 29 U/L (ref 6–29)
AST: 29 U/L (ref 10–30)
Albumin: 4.7 g/dL (ref 3.6–5.1)
Alkaline phosphatase (APISO): 62 U/L (ref 31–125)
BUN/Creatinine Ratio: 14 (calc) (ref 6–22)
BUN: 15 mg/dL (ref 7–25)
CO2: 25 mmol/L (ref 20–32)
Calcium: 9.4 mg/dL (ref 8.6–10.2)
Chloride: 101 mmol/L (ref 98–110)
Creat: 1.11 mg/dL — ABNORMAL HIGH (ref 0.50–0.97)
Globulin: 2.7 g/dL (ref 1.9–3.7)
Glucose, Bld: 133 mg/dL — ABNORMAL HIGH (ref 65–99)
Potassium: 4.4 mmol/L (ref 3.5–5.3)
Sodium: 138 mmol/L (ref 135–146)
Total Bilirubin: 0.5 mg/dL (ref 0.2–1.2)
Total Protein: 7.4 g/dL (ref 6.1–8.1)
eGFR: 66 mL/min/{1.73_m2} (ref >=60)

## 2023-10-11 LAB — TSH: TSH: 82.34 m[IU]/L — ABNORMAL HIGH

## 2023-10-11 LAB — T4, FREE: Free T4: 0.3 ng/dL — ABNORMAL LOW (ref 0.8–1.8)

## 2023-10-12 ENCOUNTER — Encounter: Payer: Self-pay | Admitting: Physician Assistant

## 2023-10-13 ENCOUNTER — Telehealth: Payer: Self-pay | Admitting: Internal Medicine

## 2023-10-13 NOTE — Telephone Encounter (Signed)
-----   Message from Alexander Iba sent at 10/12/2023  6:16 PM EDT ----- See thyroid lab results - she requested getting these drawn while we she was in the office today.

## 2023-10-13 NOTE — Telephone Encounter (Signed)
 Can you please contact the patient and see which pharmacy she has been picking up her levothyroxine from?   When I contacted [facility] on [address] they said that she has transferred her prescription out in October and they could not tell me where it was sent     Please let the patient know that I am very concerned about her thyroid levels, and was more concerning that I do not believe she has been consistently taking her levothyroxine    Please explain to the patient that she does need to take her levothyroxine otherwise she is going to have a lot of problems such as weight gain, hair loss, constipation, and even coma   Please let me know if she has been taking her levothyroxine or not, because if she has been taking it then I will need to adjust her dose and she also needs to update her pharmacy    Thanks

## 2023-10-14 ENCOUNTER — Other Ambulatory Visit: Payer: Self-pay

## 2023-10-14 MED ORDER — LEVOTHYROXINE SODIUM 150 MCG PO TABS: 150.0000 ug | ORAL_TABLET | Freq: Every day | ORAL | 3 refills | Status: AC

## 2023-10-14 NOTE — Telephone Encounter (Signed)
 Pt informed of new Rx sent to the pharmacy.   Rx sent to the correct pharmacy [facility] [address].

## 2023-11-27 ENCOUNTER — Encounter: Payer: Self-pay | Admitting: *Deleted

## 2023-11-27 DIAGNOSIS — R0981 Nasal congestion: Secondary | ICD-10-CM | POA: Diagnosis not present

## 2023-11-27 DIAGNOSIS — H66001 Acute suppurative otitis media without spontaneous rupture of ear drum, right ear: Secondary | ICD-10-CM | POA: Diagnosis not present

## 2023-11-27 DIAGNOSIS — R059 Cough, unspecified: Secondary | ICD-10-CM | POA: Diagnosis not present

## 2023-11-27 DIAGNOSIS — J069 Acute upper respiratory infection, unspecified: Secondary | ICD-10-CM | POA: Diagnosis not present

## 2023-12-01 ENCOUNTER — Ambulatory Visit: Admitting: Neurology

## 2024-01-12 ENCOUNTER — Encounter: Payer: Self-pay | Admitting: Internal Medicine

## 2024-01-12 ENCOUNTER — Ambulatory Visit: Admitting: Internal Medicine
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
## 2024-01-16 LAB — OPHTHALMOLOGY REPORT-SCANNED

## 2024-02-02 NOTE — Telephone Encounter (Signed)
 Spoke to patient  Canceling  appointment for 02/03/2024 due to Scheduling conflict Pt states will call back and reschedule . Offered to reschedule pt declined

## 2024-02-03 ENCOUNTER — Ambulatory Visit: Admitting: Neurology

## 2024-03-16 ENCOUNTER — Ambulatory Visit: Admitting: Physician Assistant

## 2024-03-16 ENCOUNTER — Ambulatory Visit: Admitting: Family Medicine

## 2024-03-16 ENCOUNTER — Encounter: Payer: Self-pay | Admitting: Family Medicine

## 2024-03-16 VITALS — BP 124/82 | HR 77 | Temp 97.8°F | Ht 60.0 in | Wt 269.0 lb

## 2024-03-16 DIAGNOSIS — J069 Acute upper respiratory infection, unspecified: Secondary | ICD-10-CM

## 2024-03-16 DIAGNOSIS — J029 Acute pharyngitis, unspecified: Secondary | ICD-10-CM

## 2024-03-16 DIAGNOSIS — H66002 Acute suppurative otitis media without spontaneous rupture of ear drum, left ear: Secondary | ICD-10-CM

## 2024-03-16 DIAGNOSIS — R051 Acute cough: Secondary | ICD-10-CM

## 2024-03-16 LAB — POC COVID19 BINAXNOW: SARS Coronavirus 2 Ag: NEGATIVE

## 2024-03-16 LAB — POCT INFLUENZA A/B
Influenza A, POC: NEGATIVE
Influenza B, POC: NEGATIVE

## 2024-03-16 MED ORDER — AMOXICILLIN-POT CLAVULANATE 875-125 MG PO TABS: 1.0000 | ORAL_TABLET | Freq: Two times a day (BID) | ORAL | 0 refills | Status: DC

## 2024-03-16 MED ORDER — PROMETHAZINE-DM 6.25-15 MG/5ML PO SYRP: 5.0000 mL | ORAL_SOLUTION | Freq: Four times a day (QID) | ORAL | 0 refills | Status: DC | PRN

## 2024-03-16 NOTE — Patient Instructions (Addendum)
 COVID testing was negative. Flu testing was negative.  I have sent in Augmentin  for you to take twice a day for 10 days.  This medication can upset your stomach, so I tell everyone to take it with a meal.  I have sent in hydrocodone  cough syrup for you to take 5 mL once daily in the evening as needed for cough.  This medication may make you sleepy.  Do not drive or operate heavy machinery while taking this medication.  Follow-up with me for new or worsening symptoms.

## 2024-03-16 NOTE — Progress Notes (Signed)
 Acute Office Visit  Subjective:     Patient ID: Heidi Ellison, female    DOB: Oct 28, 1990, 33 y.o.   MRN: [id number]  Chief Complaint  Patient presents with   Acute Visit    Symptoms started Saturday. Sore throat, dry cough, occasionally productive with yellow mucus, nasal congestion, SOB and body aches. Has tried mucinex  and nsasal sprays    HPI  Discussed the use of AI scribe software for clinical note transcription with the patient, who gave verbal consent to proceed.  History of Present Illness Heidi Ellison is a 33 year old female who presents with sore throat, congestion, and cough.  Upper respiratory symptoms - Sore throat since Sunday - Nasal congestion since Sunday - Persistent cough, worse at night - Headaches localized to the sinus area, consistent with sinus headaches  Constitutional symptoms - Fatigue and body aches since Sunday - Occasional chills and episodes of feeling very hot, leading to sweating - No fever  Musculoskeletal pain - Back pain on Sunday, preventing comfort - Back pain has since become less intense and is now variable  Symptom management - Has only taken Mucinex  for symptom relief     ROS Per HPI      Objective:    BP 124/82 (BP Location: Left Arm, Patient Position: Sitting)   Pulse 77   Temp 97.8 F (36.6 C) (Temporal)   Ht 5' (1.524 m)   Wt 269 lb (122 kg)   SpO2 97%   BMI 52.54 kg/m    Physical Exam Vitals and nursing note reviewed.  Constitutional:      General: She is not in acute distress.    Appearance: She is obese. She is ill-appearing.  HENT:     Head: Normocephalic and atraumatic.     Right Ear: External ear normal. A middle ear effusion is present.     Left Ear: External ear normal. A middle ear effusion is present. Tympanic membrane is erythematous and bulging.     Nose: Nose normal.     Mouth/Throat:     Mouth: Mucous membranes are moist.     Pharynx: Oropharynx is clear. Posterior  oropharyngeal erythema and postnasal drip present. No pharyngeal swelling, oropharyngeal exudate or uvula swelling.  Eyes:     Extraocular Movements: Extraocular movements intact.     Pupils: Pupils are equal, round, and reactive to light.  Cardiovascular:     Rate and Rhythm: Normal rate and regular rhythm.     Pulses: Normal pulses.     Heart sounds: Normal heart sounds.  Pulmonary:     Effort: Pulmonary effort is normal. No respiratory distress.     Breath sounds: Normal breath sounds. No wheezing, rhonchi or rales.  Musculoskeletal:        General: Normal range of motion.     Cervical back: Normal range of motion.     Right lower leg: No edema.     Left lower leg: No edema.  Lymphadenopathy:     Cervical: Cervical adenopathy present.  Neurological:     General: No focal deficit present.     Mental Status: She is alert and oriented to person, place, and time.  Psychiatric:        Mood and Affect: Mood normal.        Thought Content: Thought content normal.     Results for orders placed or performed in visit on 03/16/24  POC COVID-19 BinaxNow  Result Value Ref Range   SARS Coronavirus 2  Ag Negative Negative  POCT Influenza A/B  Result Value Ref Range   Influenza A, POC Negative Negative   Influenza B, POC Negative Negative        Assessment & Plan:   Assessment and Plan Assessment & Plan Acute upper respiratory infection with pharyngitis, cough, and Non-recurrent suppurative left otitis media without spontaneous TM rupture Negative COVID test. Likely viral infection with possible bacterial ear involvement. - Prescribed Augmentin  for ear infection. - Prescribed cough syrup, advised on drowsiness. - Ordered flu test; if positive, prescribe Tamiflu. - Sent prescriptions to [facility] on [address].   Orders Placed This Encounter  Procedures   POC COVID-19 BinaxNow   POCT Influenza A/B     Meds ordered this encounter  Medications    amoxicillin -clavulanate (AUGMENTIN ) 875-125 MG tablet    Sig: Take 1 tablet by mouth 2 (two) times daily.    Dispense:  20 tablet    Refill:  0   promethazine -dextromethorphan (PROMETHAZINE -DM) 6.25-15 MG/5ML syrup    Sig: Take 5 mLs by mouth 4 (four) times daily as needed for cough.    Dispense:  118 mL    Refill:  0    Return if symptoms worsen or fail to improve.  Heidi LITTIE Ku, FNP

## 2024-04-12 ENCOUNTER — Encounter: Admitting: Physician Assistant

## 2024-04-26 ENCOUNTER — Ambulatory Visit
Admission: EM | Admit: 2024-04-26 | Discharge: 2024-04-26 | Disposition: A | Discharge status: Home / Self Care | Attending: Family Medicine | Admitting: Family Medicine

## 2024-04-26 ENCOUNTER — Other Ambulatory Visit: Payer: Self-pay

## 2024-04-26 DIAGNOSIS — B349 Viral infection, unspecified: Secondary | ICD-10-CM

## 2024-04-26 DIAGNOSIS — R051 Acute cough: Secondary | ICD-10-CM

## 2024-04-26 DIAGNOSIS — R030 Elevated blood-pressure reading, without diagnosis of hypertension: Secondary | ICD-10-CM | POA: Diagnosis not present

## 2024-04-26 LAB — POC COVID19/FLU A&B COMBO
Covid Antigen, POC: NEGATIVE
Influenza A Antigen, POC: NEGATIVE
Influenza B Antigen, POC: NEGATIVE

## 2024-04-26 NOTE — Discharge Instructions (Addendum)
 You tested negative for COVID and flu.  Your BP was elevated while in the clinic.  This could be related to over-the-counter cold medicine.  please monitor this at home and follow-up with your PCP.  Please treat your symptoms with over the counter cough medication, tylenol  or ibuprofen, humidifier, and rest. Viral illnesses can last 7-14 days. Please follow up with your PCP if your symptoms are not improving. Please go to the ER for any worsening symptoms. This includes but is not limited to fever you can not control with tylenol  or ibuprofen, you are not able to stay hydrated, you have shortness of breath or chest pain.  Thank you for choosing [facility] for your healthcare needs. I hope you feel better soon!

## 2024-04-26 NOTE — ED Triage Notes (Signed)
 Pt c/o dry cough, chest congestion, nasal congestionx2d. Loss of voice yesterday, but it's back today
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
## 2024-07-12 ENCOUNTER — Encounter: Admitting: Physician Assistant

## 2024-07-22 ENCOUNTER — Ambulatory Visit: Admitting: Physician Assistant

## 2024-07-22 ENCOUNTER — Encounter: Payer: Self-pay | Admitting: Physician Assistant

## 2024-07-22 VITALS — BP 136/84 | HR 68 | Temp 98.2°F | Ht 60.0 in | Wt 272.4 lb

## 2024-07-22 DIAGNOSIS — Z23 Encounter for immunization: Secondary | ICD-10-CM | POA: Diagnosis not present

## 2024-07-22 DIAGNOSIS — Z0000 Encounter for general adult medical examination without abnormal findings: Secondary | ICD-10-CM

## 2024-07-22 DIAGNOSIS — G4733 Obstructive sleep apnea (adult) (pediatric): Secondary | ICD-10-CM | POA: Diagnosis not present

## 2024-07-22 DIAGNOSIS — E1165 Type 2 diabetes mellitus with hyperglycemia: Secondary | ICD-10-CM

## 2024-07-22 DIAGNOSIS — R030 Elevated blood-pressure reading, without diagnosis of hypertension: Secondary | ICD-10-CM

## 2024-07-22 DIAGNOSIS — F419 Anxiety disorder, unspecified: Secondary | ICD-10-CM

## 2024-07-22 DIAGNOSIS — E890 Postprocedural hypothyroidism: Secondary | ICD-10-CM

## 2024-07-22 LAB — CBC WITH DIFFERENTIAL/PLATELET
Basophils Absolute: 0 K/uL (ref 0.0–0.1)
Basophils Relative: 0.9 % (ref 0.0–3.0)
Eosinophils Absolute: 0.1 K/uL (ref 0.0–0.7)
Eosinophils Relative: 2.1 % (ref 0.0–5.0)
HCT: 43.7 % (ref 36.0–46.0)
Hemoglobin: 14.6 g/dL (ref 12.0–15.0)
Lymphocytes Relative: 40.5 % (ref 12.0–46.0)
Lymphs Abs: 2 K/uL (ref 0.7–4.0)
MCHC: 33.5 g/dL (ref 30.0–36.0)
MCV: 90.9 fl (ref 78.0–100.0)
Monocytes Absolute: 0.3 K/uL (ref 0.1–1.0)
Monocytes Relative: 6.4 % (ref 3.0–12.0)
Neutro Abs: 2.5 K/uL (ref 1.4–7.7)
Neutrophils Relative %: 50.1 % (ref 43.0–77.0)
Platelets: 278 K/uL (ref 150.0–400.0)
RBC: 4.81 Mil/uL (ref 3.87–5.11)
RDW: 13.6 % (ref 11.5–15.5)
WBC: 5.1 K/uL (ref 4.0–10.5)

## 2024-07-22 LAB — COMPREHENSIVE METABOLIC PANEL WITH GFR
ALT: 42 U/L — ABNORMAL HIGH (ref 3–35)
AST: 28 U/L (ref 5–37)
Albumin: 4.3 g/dL (ref 3.5–5.2)
Alkaline Phosphatase: 56 U/L (ref 39–117)
BUN: 11 mg/dL (ref 6–23)
CO2: 28 meq/L (ref 19–32)
Calcium: 9.3 mg/dL (ref 8.4–10.5)
Chloride: 102 meq/L (ref 96–112)
Creatinine, Ser: 0.64 mg/dL (ref 0.40–1.20)
GFR: 114.03 mL/min
Glucose, Bld: 137 mg/dL — ABNORMAL HIGH (ref 70–99)
Potassium: 4.6 meq/L (ref 3.5–5.1)
Sodium: 137 meq/L (ref 135–145)
Total Bilirubin: 0.4 mg/dL (ref 0.2–1.2)
Total Protein: 7.5 g/dL (ref 6.0–8.3)

## 2024-07-22 LAB — LIPID PANEL
Cholesterol: 218 mg/dL — ABNORMAL HIGH (ref 28–200)
HDL: 40 mg/dL
LDL Cholesterol: 149 mg/dL — ABNORMAL HIGH (ref 10–99)
NonHDL: 178.37
Total CHOL/HDL Ratio: 5
Triglycerides: 149 mg/dL (ref 10.0–149.0)
VLDL: 29.8 mg/dL (ref 0.0–40.0)

## 2024-07-22 LAB — TSH: TSH: 22.94 u[IU]/mL — ABNORMAL HIGH (ref 0.35–5.50)

## 2024-07-22 LAB — MICROALBUMIN / CREATININE URINE RATIO
Creatinine,U: 119.4 mg/dL
Microalb Creat Ratio: 43 mg/g — ABNORMAL HIGH (ref 0.0–30.0)
Microalb, Ur: 5.1 mg/dL — ABNORMAL HIGH (ref 0.7–1.9)

## 2024-07-22 LAB — HEMOGLOBIN A1C: Hgb A1c MFr Bld: 7.8 % — ABNORMAL HIGH (ref 4.6–6.5)

## 2024-07-22 LAB — T4, FREE: Free T4: 0.81 ng/dL (ref 0.60–1.60)

## 2024-07-22 MED ORDER — TIRZEPATIDE 2.5 MG/0.5ML ~~LOC~~ SOAJ: 2.5000 mg | SUBCUTANEOUS | 1 refills | Status: AC

## 2024-07-22 MED ORDER — METFORMIN HCL 500 MG PO TABS: 500.0000 mg | ORAL_TABLET | Freq: Every day | ORAL | 0 refills | Status: AC
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
## 2024-07-22 NOTE — Patient Instructions (Addendum)
 SABRA

## 2024-07-23 ENCOUNTER — Other Ambulatory Visit (HOSPITAL_COMMUNITY): Payer: Self-pay

## 2024-07-23 ENCOUNTER — Ambulatory Visit: Payer: Self-pay | Admitting: Physician Assistant

## 2024-07-28 ENCOUNTER — Telehealth: Payer: Self-pay

## 2024-07-28 ENCOUNTER — Other Ambulatory Visit (HOSPITAL_COMMUNITY): Payer: Self-pay

## 2024-07-28 NOTE — Telephone Encounter (Signed)
 Pharmacy Patient Advocate Encounter   Received notification from [employer] that prior authorization for Mounjaro  2.5MG /0.5ML auto-injectors is required/requested.   Insurance verification completed.   The patient is insured through [employer].   Per test claim: PA required; PA submitted to above mentioned insurance via Latent Key/confirmation #/EOC [id number] Status is pending

## 2024-07-29 ENCOUNTER — Other Ambulatory Visit (HOSPITAL_COMMUNITY): Payer: Self-pay

## 2024-07-29 NOTE — Telephone Encounter (Signed)
 Pharmacy Patient Advocate Encounter  Received notification from [employer] that Prior Authorization for  Mounjaro  2.5MG /0.5ML auto-injectors  has been APPROVED from 06/28/24 to 07/28/25. Ran test claim, Copay is $35.00. This test claim was processed through [facility]- copay amounts may vary at other pharmacies due to pharmacy/plan contracts, or as the patient moves through the different stages of their insurance plan.   PA #/Case ID/Reference #: [id number]

## 2024-08-10 ENCOUNTER — Encounter: Admitting: Physician Assistant

## 2024-10-22 ENCOUNTER — Ambulatory Visit: Admitting: Physician Assistant
